# Patient Record
Sex: Male | Born: 1940 | Race: White | Hispanic: No | Smoking: Former smoker
Health system: Southern US, Community
[De-identification: ages and names within clinical notes are randomized; demographics above are authoritative.]

## PROBLEM LIST (undated history)

## (undated) DIAGNOSIS — R269 Unspecified abnormalities of gait and mobility: Secondary | ICD-10-CM

## (undated) DIAGNOSIS — F419 Anxiety disorder, unspecified: Secondary | ICD-10-CM

## (undated) DIAGNOSIS — G629 Polyneuropathy, unspecified: Secondary | ICD-10-CM

## (undated) DIAGNOSIS — J449 Chronic obstructive pulmonary disease, unspecified: Secondary | ICD-10-CM

## (undated) DIAGNOSIS — I2510 Atherosclerotic heart disease of native coronary artery without angina pectoris: Secondary | ICD-10-CM

## (undated) DIAGNOSIS — I739 Peripheral vascular disease, unspecified: Secondary | ICD-10-CM

## (undated) DIAGNOSIS — S0990XA Unspecified injury of head, initial encounter: Secondary | ICD-10-CM

## (undated) DIAGNOSIS — R0602 Shortness of breath: Secondary | ICD-10-CM

## (undated) DIAGNOSIS — H259 Unspecified age-related cataract: Secondary | ICD-10-CM

## (undated) DIAGNOSIS — H3530 Unspecified macular degeneration: Secondary | ICD-10-CM

## (undated) DIAGNOSIS — F17210 Nicotine dependence, cigarettes, uncomplicated: Secondary | ICD-10-CM

## (undated) DIAGNOSIS — F1010 Alcohol abuse, uncomplicated: Secondary | ICD-10-CM

## (undated) HISTORY — DX: Unspecified age-related cataract: H25.9

## (undated) HISTORY — DX: Polyneuropathy, unspecified: G62.9

## (undated) HISTORY — DX: Unspecified abnormalities of gait and mobility: R26.9

## (undated) HISTORY — DX: Unspecified injury of head, initial encounter: S09.90XA

## (undated) HISTORY — DX: Nicotine dependence, cigarettes, uncomplicated: F17.210

## (undated) HISTORY — DX: Alcohol abuse, uncomplicated: F10.10

## (undated) HISTORY — DX: Unspecified macular degeneration: H35.30

## (undated) HISTORY — DX: Atherosclerotic heart disease of native coronary artery without angina pectoris: I25.10

## (undated) HISTORY — PX: APPENDECTOMY: SHX54

## (undated) HISTORY — DX: Anxiety disorder, unspecified: F41.9

## (undated) HISTORY — DX: Chronic obstructive pulmonary disease, unspecified: J44.9

## (undated) HISTORY — DX: Peripheral vascular disease, unspecified: I73.9

---

## 1980-01-31 HISTORY — PX: BRAIN SURGERY: SHX531

## 1999-08-31 ENCOUNTER — Emergency Department (HOSPITAL_COMMUNITY): Admission: EM | Admit: 1999-08-31 | Discharge: 1999-08-31 | Payer: Self-pay | Admitting: Emergency Medicine

## 1999-09-01 ENCOUNTER — Encounter: Payer: Self-pay | Admitting: Emergency Medicine

## 2000-06-19 ENCOUNTER — Encounter: Payer: Self-pay | Admitting: Internal Medicine

## 2000-06-19 ENCOUNTER — Ambulatory Visit (HOSPITAL_COMMUNITY): Admission: RE | Admit: 2000-06-19 | Discharge: 2000-06-19 | Payer: Self-pay | Admitting: Internal Medicine

## 2000-09-24 ENCOUNTER — Ambulatory Visit (HOSPITAL_COMMUNITY): Admission: RE | Admit: 2000-09-24 | Discharge: 2000-09-24 | Payer: Self-pay | Admitting: Internal Medicine

## 2003-06-30 ENCOUNTER — Ambulatory Visit (HOSPITAL_COMMUNITY): Admission: RE | Admit: 2003-06-30 | Discharge: 2003-06-30 | Payer: Self-pay | Admitting: Pulmonary Disease

## 2003-11-11 ENCOUNTER — Ambulatory Visit: Payer: Self-pay | Admitting: Pulmonary Disease

## 2003-11-19 ENCOUNTER — Ambulatory Visit (HOSPITAL_COMMUNITY): Admission: RE | Admit: 2003-11-19 | Discharge: 2003-11-19 | Payer: Self-pay | Admitting: Pulmonary Disease

## 2003-12-03 ENCOUNTER — Ambulatory Visit: Payer: Self-pay | Admitting: Pulmonary Disease

## 2004-03-07 ENCOUNTER — Ambulatory Visit: Payer: Self-pay | Admitting: Pulmonary Disease

## 2004-03-19 ENCOUNTER — Emergency Department (HOSPITAL_COMMUNITY): Admission: EM | Admit: 2004-03-19 | Discharge: 2004-03-20 | Payer: Self-pay | Admitting: Emergency Medicine

## 2004-04-16 ENCOUNTER — Observation Stay (HOSPITAL_COMMUNITY): Admission: EM | Admit: 2004-04-16 | Discharge: 2004-04-17 | Payer: Self-pay | Admitting: Emergency Medicine

## 2004-04-22 ENCOUNTER — Ambulatory Visit: Payer: Self-pay | Admitting: Pulmonary Disease

## 2004-06-06 ENCOUNTER — Ambulatory Visit: Payer: Self-pay | Admitting: Pulmonary Disease

## 2004-06-09 ENCOUNTER — Ambulatory Visit: Payer: Self-pay | Admitting: Internal Medicine

## 2004-08-04 ENCOUNTER — Ambulatory Visit: Payer: Self-pay | Admitting: Orthopedic Surgery

## 2004-08-29 ENCOUNTER — Emergency Department (HOSPITAL_COMMUNITY): Admission: EM | Admit: 2004-08-29 | Discharge: 2004-08-29 | Payer: Self-pay | Admitting: *Deleted

## 2004-09-20 ENCOUNTER — Ambulatory Visit: Payer: Self-pay | Admitting: Pulmonary Disease

## 2004-10-26 ENCOUNTER — Ambulatory Visit: Payer: Self-pay | Admitting: Pulmonary Disease

## 2005-01-04 ENCOUNTER — Ambulatory Visit: Payer: Self-pay | Admitting: Pulmonary Disease

## 2005-03-15 ENCOUNTER — Inpatient Hospital Stay (HOSPITAL_COMMUNITY): Admission: EM | Admit: 2005-03-15 | Discharge: 2005-03-20 | Payer: Self-pay | Admitting: Emergency Medicine

## 2005-04-03 ENCOUNTER — Ambulatory Visit: Payer: Self-pay | Admitting: Pulmonary Disease

## 2005-05-22 ENCOUNTER — Ambulatory Visit: Payer: Self-pay | Admitting: Pulmonary Disease

## 2006-05-02 ENCOUNTER — Ambulatory Visit: Payer: Self-pay | Admitting: Pulmonary Disease

## 2006-05-02 LAB — CONVERTED CEMR LAB
Basophils Relative: 0.5 % (ref 0.0–1.0)
Bilirubin, Direct: 0.1 mg/dL (ref 0.0–0.3)
CO2: 30 meq/L (ref 19–32)
Eosinophils Relative: 1.4 % (ref 0.0–5.0)
GFR calc Af Amer: 96 mL/min
Glucose, Bld: 95 mg/dL (ref 70–99)
Hemoglobin: 11.6 g/dL — ABNORMAL LOW (ref 13.0–17.0)
Lymphocytes Relative: 10 % — ABNORMAL LOW (ref 12.0–46.0)
Monocytes Absolute: 0.4 10*3/uL (ref 0.2–0.7)
Monocytes Relative: 3.2 % (ref 3.0–11.0)
Neutro Abs: 9.5 10*3/uL — ABNORMAL HIGH (ref 1.4–7.7)
Neutrophils Relative %: 84.9 % — ABNORMAL HIGH (ref 43.0–77.0)
Potassium: 5.8 meq/L — ABNORMAL HIGH (ref 3.5–5.1)
Sodium: 139 meq/L (ref 135–145)
TSH: 0.73 microintl units/mL (ref 0.35–5.50)
Total Protein: 7.6 g/dL (ref 6.0–8.3)
WBC: 11.3 10*3/uL — ABNORMAL HIGH (ref 4.5–10.5)

## 2006-06-07 ENCOUNTER — Ambulatory Visit: Payer: Self-pay | Admitting: Orthopedic Surgery

## 2006-07-12 ENCOUNTER — Emergency Department (HOSPITAL_COMMUNITY): Admission: EM | Admit: 2006-07-12 | Discharge: 2006-07-13 | Payer: Self-pay | Admitting: Emergency Medicine

## 2006-07-22 ENCOUNTER — Emergency Department (HOSPITAL_COMMUNITY): Admission: EM | Admit: 2006-07-22 | Discharge: 2006-07-22 | Payer: Self-pay | Admitting: Emergency Medicine

## 2006-09-18 ENCOUNTER — Ambulatory Visit: Payer: Self-pay | Admitting: Pulmonary Disease

## 2006-11-13 DIAGNOSIS — I872 Venous insufficiency (chronic) (peripheral): Secondary | ICD-10-CM | POA: Insufficient documentation

## 2006-11-13 DIAGNOSIS — F1010 Alcohol abuse, uncomplicated: Secondary | ICD-10-CM | POA: Insufficient documentation

## 2006-11-13 DIAGNOSIS — J4489 Other specified chronic obstructive pulmonary disease: Secondary | ICD-10-CM | POA: Insufficient documentation

## 2006-11-13 DIAGNOSIS — G589 Mononeuropathy, unspecified: Secondary | ICD-10-CM | POA: Insufficient documentation

## 2006-11-13 DIAGNOSIS — F411 Generalized anxiety disorder: Secondary | ICD-10-CM | POA: Insufficient documentation

## 2006-11-13 DIAGNOSIS — J449 Chronic obstructive pulmonary disease, unspecified: Secondary | ICD-10-CM

## 2007-04-09 ENCOUNTER — Ambulatory Visit: Payer: Self-pay | Admitting: Pulmonary Disease

## 2007-04-09 DIAGNOSIS — F17200 Nicotine dependence, unspecified, uncomplicated: Secondary | ICD-10-CM | POA: Insufficient documentation

## 2007-04-09 DIAGNOSIS — I739 Peripheral vascular disease, unspecified: Secondary | ICD-10-CM | POA: Insufficient documentation

## 2007-04-09 DIAGNOSIS — R269 Unspecified abnormalities of gait and mobility: Secondary | ICD-10-CM

## 2007-04-17 ENCOUNTER — Encounter: Payer: Self-pay | Admitting: Pulmonary Disease

## 2007-04-17 ENCOUNTER — Telehealth (INDEPENDENT_AMBULATORY_CARE_PROVIDER_SITE_OTHER): Payer: Self-pay

## 2007-04-20 DIAGNOSIS — S0990XA Unspecified injury of head, initial encounter: Secondary | ICD-10-CM | POA: Insufficient documentation

## 2007-04-20 LAB — CONVERTED CEMR LAB
ALT: 14 units/L (ref 0–53)
AST: 20 units/L (ref 0–37)
Alkaline Phosphatase: 31 units/L — ABNORMAL LOW (ref 39–117)
BUN: 15 mg/dL (ref 6–23)
Basophils Relative: 0 % (ref 0.0–1.0)
Bilirubin, Direct: 0.1 mg/dL (ref 0.0–0.3)
CO2: 33 meq/L — ABNORMAL HIGH (ref 19–32)
Calcium: 9.9 mg/dL (ref 8.4–10.5)
Chloride: 103 meq/L (ref 96–112)
Eosinophils Absolute: 0.3 10*3/uL (ref 0.0–0.6)
Eosinophils Relative: 2.8 % (ref 0.0–5.0)
GFR calc non Af Amer: 89 mL/min
Glucose, Bld: 96 mg/dL (ref 70–99)
Monocytes Relative: 5.7 % (ref 3.0–11.0)
Platelets: 262 10*3/uL (ref 150–400)
RBC: 4.49 M/uL (ref 4.22–5.81)
WBC: 12.4 10*3/uL — ABNORMAL HIGH (ref 4.5–10.5)

## 2007-10-08 ENCOUNTER — Ambulatory Visit: Payer: Self-pay | Admitting: Pulmonary Disease

## 2008-03-31 ENCOUNTER — Ambulatory Visit: Payer: Self-pay | Admitting: Pulmonary Disease

## 2008-03-31 DIAGNOSIS — J209 Acute bronchitis, unspecified: Secondary | ICD-10-CM | POA: Insufficient documentation

## 2008-03-31 LAB — CONVERTED CEMR LAB
AST: 24 units/L (ref 0–37)
Albumin: 3.7 g/dL (ref 3.5–5.2)
BUN: 14 mg/dL (ref 6–23)
Basophils Absolute: 0 10*3/uL (ref 0.0–0.1)
Basophils Relative: 0.2 % (ref 0.0–3.0)
Calcium: 9.5 mg/dL (ref 8.4–10.5)
Creatinine, Ser: 0.9 mg/dL (ref 0.4–1.5)
Eosinophils Absolute: 0.2 10*3/uL (ref 0.0–0.7)
Eosinophils Relative: 1.7 % (ref 0.0–5.0)
GFR calc Af Amer: 108 mL/min
GFR calc non Af Amer: 89 mL/min
HCT: 39.4 % (ref 39.0–52.0)
Hemoglobin: 13.6 g/dL (ref 13.0–17.0)
MCHC: 34.6 g/dL (ref 30.0–36.0)
MCV: 90.2 fL (ref 78.0–100.0)
Monocytes Absolute: 0.5 10*3/uL (ref 0.1–1.0)
Neutro Abs: 7.3 10*3/uL (ref 1.4–7.7)
Neutrophils Relative %: 79.3 % — ABNORMAL HIGH (ref 43.0–77.0)
PSA: 0.37 ng/mL (ref 0.10–4.00)
RBC: 4.36 M/uL (ref 4.22–5.81)
TSH: 0.53 microintl units/mL (ref 0.35–5.50)
Total Bilirubin: 0.9 mg/dL (ref 0.3–1.2)
WBC: 9.3 10*3/uL (ref 4.5–10.5)

## 2008-04-01 ENCOUNTER — Telehealth (INDEPENDENT_AMBULATORY_CARE_PROVIDER_SITE_OTHER): Payer: Self-pay | Admitting: *Deleted

## 2008-05-09 ENCOUNTER — Encounter: Payer: Self-pay | Admitting: Pulmonary Disease

## 2008-05-19 ENCOUNTER — Ambulatory Visit: Payer: Self-pay | Admitting: Pulmonary Disease

## 2008-05-19 ENCOUNTER — Ambulatory Visit: Payer: Self-pay | Admitting: Internal Medicine

## 2008-05-20 DIAGNOSIS — S20219A Contusion of unspecified front wall of thorax, initial encounter: Secondary | ICD-10-CM

## 2008-08-21 ENCOUNTER — Ambulatory Visit: Payer: Self-pay | Admitting: Internal Medicine

## 2008-08-21 ENCOUNTER — Encounter: Payer: Self-pay | Admitting: Adult Health

## 2008-08-21 DIAGNOSIS — H3530 Unspecified macular degeneration: Secondary | ICD-10-CM | POA: Insufficient documentation

## 2008-08-21 DIAGNOSIS — H259 Unspecified age-related cataract: Secondary | ICD-10-CM | POA: Insufficient documentation

## 2008-08-21 LAB — CONVERTED CEMR LAB: INR: 0.9 (ref 0.8–1.0)

## 2008-10-01 ENCOUNTER — Ambulatory Visit: Payer: Self-pay | Admitting: Pulmonary Disease

## 2008-10-04 ENCOUNTER — Inpatient Hospital Stay (HOSPITAL_COMMUNITY): Admission: EM | Admit: 2008-10-04 | Discharge: 2008-10-09 | Payer: Self-pay | Admitting: Emergency Medicine

## 2008-11-02 ENCOUNTER — Encounter: Payer: Self-pay | Admitting: Pulmonary Disease

## 2008-11-10 ENCOUNTER — Ambulatory Visit: Payer: Self-pay | Admitting: Pulmonary Disease

## 2009-04-15 ENCOUNTER — Ambulatory Visit: Payer: Self-pay | Admitting: Pulmonary Disease

## 2009-04-16 LAB — CONVERTED CEMR LAB
AST: 25 units/L (ref 0–37)
Alkaline Phosphatase: 35 units/L — ABNORMAL LOW (ref 39–117)
Basophils Absolute: 0 10*3/uL (ref 0.0–0.1)
Basophils Relative: 0.3 % (ref 0.0–3.0)
Bilirubin, Direct: 0.1 mg/dL (ref 0.0–0.3)
CO2: 28 meq/L (ref 19–32)
Calcium: 9.8 mg/dL (ref 8.4–10.5)
Creatinine, Ser: 1.1 mg/dL (ref 0.4–1.5)
Eosinophils Absolute: 0.3 10*3/uL (ref 0.0–0.7)
GFR calc non Af Amer: 70.36 mL/min (ref >60)
Hemoglobin: 13.1 g/dL (ref 13.0–17.0)
Lymphocytes Relative: 12.5 % (ref 12.0–46.0)
MCHC: 33.1 g/dL (ref 30.0–36.0)
Monocytes Relative: 3.3 % (ref 3.0–12.0)
Neutro Abs: 11.4 10*3/uL — ABNORMAL HIGH (ref 1.4–7.7)
Neutrophils Relative %: 82.1 % — ABNORMAL HIGH (ref 43.0–77.0)
RBC: 4.35 M/uL (ref 4.22–5.81)
RDW: 13 % (ref 11.5–14.6)
Sodium: 137 meq/L (ref 135–145)

## 2009-04-19 ENCOUNTER — Telehealth (INDEPENDENT_AMBULATORY_CARE_PROVIDER_SITE_OTHER): Payer: Self-pay | Admitting: *Deleted

## 2009-05-14 ENCOUNTER — Telehealth (INDEPENDENT_AMBULATORY_CARE_PROVIDER_SITE_OTHER): Payer: Self-pay | Admitting: *Deleted

## 2009-09-15 ENCOUNTER — Telehealth (INDEPENDENT_AMBULATORY_CARE_PROVIDER_SITE_OTHER): Payer: Self-pay | Admitting: *Deleted

## 2009-11-12 ENCOUNTER — Telehealth (INDEPENDENT_AMBULATORY_CARE_PROVIDER_SITE_OTHER): Payer: Self-pay | Admitting: *Deleted

## 2009-11-18 ENCOUNTER — Ambulatory Visit: Payer: Self-pay | Admitting: Pulmonary Disease

## 2009-11-18 ENCOUNTER — Telehealth: Payer: Self-pay | Admitting: Pulmonary Disease

## 2009-12-13 ENCOUNTER — Encounter: Payer: Self-pay | Admitting: Pulmonary Disease

## 2009-12-21 ENCOUNTER — Telehealth: Payer: Self-pay | Admitting: Pulmonary Disease

## 2010-03-01 NOTE — Progress Notes (Signed)
Summary: vicodin refill  Phone Note Call from Patient   Caller: Daughter Call For: nadel Summary of Call: pt was seen 3/17. needs a new rx for vicodin. same pharmacy # [phone]. daughter Pura Spice calling at [phone] Initial call taken by: Tivis Ringer, CNA,  April 19, 2009 2:48 PM  Follow-up for Phone Call        rx sent to pharmacy. daughter aware.  Aundra Millet Reynolds LPN  April 19, 2009 2:53 PM    Prescriptions: VICODIN 5-500 MG  TABS (HYDROCODONE-ACETAMINOPHEN) take 1 by mouth every 4-6 hrs as needed pain-not to exceed three per day  #90 x 2   Entered by:   Arman Filter LPN   Authorized by:   Michele Mcalpine MD   Signed by:   Arman Filter LPN on 17/61/6073   Method used:   Telephoned to ...       [facility] (retail)       [address]       [place], [place]  [address]       Ph: [phone]       Fax: [phone]   RxID:   [id number]
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
## 2010-03-01 NOTE — Progress Notes (Signed)
Summary: wants new rx  Phone Note Call from Patient Call back at [phone]   Caller: Daughter--donna jones Call For: nadel Summary of Call: would like rx for xanax to help with nerves--pls advise if this is ok--pt last saw sn 09/2008 and tp 03/2009  Initial call taken by: Philipp Deputy CMA,  May 14, 2009 9:50 AM Caller: Antonio Marshall  Follow-up for Phone Call        donna aware that per SN---unable to give him this rx for xanax.  pt is currently taking zoloft [name] CMA  May 18, 2009 9:59 AM

## 2010-03-01 NOTE — Progress Notes (Signed)
Summary: refill onhydrocodone--med reday at pharmacy  Phone Note Call from Patient   Caller: Daughter donna Call For: nadel Summary of Call: need [facility]one [facility] Initial call taken by: Rickard Patience,  December 21, 2009 10:30 AM  Follow-up for Phone Call        Looks like hydrocodone was refilled yeaterday. [facility] called [facility] to verify and they have medication ready for the pt. I LMTCBx1 with pt daughter to advsie.Carron Curie CMA  December 21, 2009 10:40 AM  spoke with [name] and made aware rx ready. Carron Curie CMA  December 21, 2009 10:47 AM

## 2010-03-01 NOTE — Progress Notes (Signed)
Summary: meds  Phone Note Call from Patient Call back at [phone]   Caller: daughter-[name] Call For: nadel Reason for Call: Talk to Nurse Summary of Call: pt needs an rx for Vicodin refilled, [facility] Initial call taken by: Eugene Gavia,  September 15, 2009 11:13 AM  Follow-up for Phone Call        Spoke with [facility] refill sent and to keep appt in Oct with SN.Reynaldo Minium CMA  September 15, 2009 11:53 AM     Prescriptions: VICODIN 5-500 MG  TABS (HYDROCODONE-ACETAMINOPHEN) take 1 by mouth every 4-6 hrs as needed pain-not to exceed three per day  #90 x 2   Entered by:   Reynaldo Minium CMA   Authorized by:   Michele Mcalpine MD   Signed by:   Reynaldo Minium CMA on 09/15/2009   Method used:   Telephoned to ...       [facility]  [address] [phone]* (retail)       [address]       [place], [place]  [address]       Ph: [phone] or [phone]       Fax: [phone]   RxID:   [id number]

## 2010-03-01 NOTE — Letter (Signed)
Summary: [facility]  [facility]   Imported By: Lester [place] 12/17/2009 11:21:51  _____________________________________________________________________  External Attachment:    Type:   Image     Comment:   External Document

## 2010-03-01 NOTE — Progress Notes (Signed)
Summary: rsc for next wk w/ nadel---appt 11/18/2009  Phone Note Call from Patient   Caller: Daughter-donna jones Call For: nadel Summary of Call: needs to rsc w/ nadel for next week. (had an appt but is sick and cancelled).  Initial call taken by: Tivis Ringer, CNA,  November 12, 2009 9:17 AM  Follow-up for Phone Call        ATC pt's daughter back at home #.  Line has been disconnected.  Found an alternative # in a previous message and called that # instead......[phone].  SPoke with pt's daughter, Antonio Marshall, and rescheduled pt to see SN 11/18/2009 at 9:00am.  Arman Filter LPN  November 12, 2009 10:42 AM

## 2010-03-01 NOTE — Progress Notes (Signed)
Summary: order   [id number]  Phone Note From Other Clinic Call back at [phone] ex [phone]   [facility] Call For: nadel Summary of Call: need verbral order for bed. Initial call taken by: Rickard Patience,  November 18, 2009 3:40 PM  Follow-up for Phone Call        [facility].  Pt was just seen today by SN and order was sent to [facility] for a new mattress.  Unsure what she is needing.  Aundra Millet Reynolds LPN  November 18, 2009 3:42 PM   Derrin (covering for [place] with [facility]) stated that the patient was supplied a hosp. bed in the past (maybe from [place]). [facility] is unable to provide a new mattress, they would have to get pt a new bed. Is this ok? Or do you want me to try [facility]? Please advise. Alfonso Ramus  November 18, 2009 3:46 PM   Additional Follow-up for Phone Call Additional follow up Details #1::        lmom,tcb for aisha---- [name] CMA  November 18, 2009 4:45 PM     Arley Phenix called me back and she will send the order in to [facility] for a new bed for this pt---pts daughter is aware [name] [facility]  November 18, 2009 4:52 PM
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
## 2010-04-24 ENCOUNTER — Other Ambulatory Visit: Payer: Self-pay | Admitting: Adult Health

## 2010-05-06 LAB — URINALYSIS, ROUTINE W REFLEX MICROSCOPIC
Bilirubin Urine: NEGATIVE
Glucose, UA: NEGATIVE mg/dL
Ketones, ur: 15 mg/dL — AB
Specific Gravity, Urine: 1.025 (ref 1.005–1.030)
pH: 6 (ref 5.0–8.0)

## 2010-05-06 LAB — COMPREHENSIVE METABOLIC PANEL
ALT: 31 U/L (ref 0–53)
AST: 37 U/L (ref 0–37)
Albumin: 2.8 g/dL — ABNORMAL LOW (ref 3.5–5.2)
Albumin: 3 g/dL — ABNORMAL LOW (ref 3.5–5.2)
BUN: 15 mg/dL (ref 6–23)
BUN: 19 mg/dL (ref 6–23)
CO2: 18 mEq/L — ABNORMAL LOW (ref 19–32)
CO2: 27 mEq/L (ref 19–32)
Calcium: 8.7 mg/dL (ref 8.4–10.5)
Chloride: 101 mEq/L (ref 96–112)
Chloride: 109 mEq/L (ref 96–112)
Creatinine, Ser: 0.8 mg/dL (ref 0.4–1.5)
Creatinine, Ser: 0.92 mg/dL (ref 0.4–1.5)
Creatinine, Ser: 1 mg/dL (ref 0.4–1.5)
GFR calc Af Amer: >60 mL/min (ref >60)
GFR calc non Af Amer: >60 mL/min (ref >60)
GFR calc non Af Amer: >60 mL/min (ref >60)
Glucose, Bld: 85 mg/dL (ref 70–99)
Potassium: 3.6 mEq/L (ref 3.5–5.1)
Sodium: 140 mEq/L (ref 135–145)
Total Bilirubin: 0.6 mg/dL (ref 0.3–1.2)
Total Bilirubin: 1.3 mg/dL — ABNORMAL HIGH (ref 0.3–1.2)
Total Protein: 6 g/dL (ref 6.0–8.3)

## 2010-05-06 LAB — CBC
HCT: 30.9 % — ABNORMAL LOW (ref 39.0–52.0)
HCT: 31.8 % — ABNORMAL LOW (ref 39.0–52.0)
HCT: 33.8 % — ABNORMAL LOW (ref 39.0–52.0)
HCT: 37.9 % — ABNORMAL LOW (ref 39.0–52.0)
Hemoglobin: 10.6 g/dL — ABNORMAL LOW (ref 13.0–17.0)
MCHC: 34.5 g/dL (ref 30.0–36.0)
MCHC: 35.4 g/dL (ref 30.0–36.0)
MCV: 88 fL (ref 78.0–100.0)
MCV: 89.6 fL (ref 78.0–100.0)
MCV: 90.1 fL (ref 78.0–100.0)
Platelets: 167 10*3/uL (ref 150–400)
Platelets: 169 10*3/uL (ref 150–400)
Platelets: 197 10*3/uL (ref 150–400)
RBC: 3.53 MIL/uL — ABNORMAL LOW (ref 4.22–5.81)
RBC: 3.7 MIL/uL — ABNORMAL LOW (ref 4.22–5.81)
RBC: 4.3 MIL/uL (ref 4.22–5.81)
RDW: 13.9 % (ref 11.5–15.5)
RDW: 14 % (ref 11.5–15.5)
WBC: 24.1 10*3/uL — ABNORMAL HIGH (ref 4.0–10.5)
WBC: 7.4 10*3/uL (ref 4.0–10.5)
WBC: 8.2 10*3/uL (ref 4.0–10.5)

## 2010-05-06 LAB — BASIC METABOLIC PANEL
Calcium: 8.8 mg/dL (ref 8.4–10.5)
Chloride: 108 mEq/L (ref 96–112)
Chloride: 109 mEq/L (ref 96–112)
Creatinine, Ser: 0.91 mg/dL (ref 0.4–1.5)
Creatinine, Ser: 0.97 mg/dL (ref 0.4–1.5)
GFR calc Af Amer: >60 mL/min (ref >60)
GFR calc Af Amer: >60 mL/min (ref >60)
GFR calc non Af Amer: >60 mL/min (ref >60)
Potassium: 3.7 mEq/L (ref 3.5–5.1)

## 2010-05-06 LAB — CARDIAC PANEL(CRET KIN+CKTOT+MB+TROPI)
Relative Index: 1 (ref 0.0–2.5)
Relative Index: 1 (ref 0.0–2.5)
Total CK: 2445 U/L — ABNORMAL HIGH (ref 7–232)
Troponin I: 0.03 ng/mL (ref 0.00–0.06)
Troponin I: 0.03 ng/mL (ref 0.00–0.06)

## 2010-05-06 LAB — DIFFERENTIAL
Basophils Absolute: 0 10*3/uL (ref 0.0–0.1)
Basophils Absolute: 0 10*3/uL (ref 0.0–0.1)
Basophils Absolute: 0 10*3/uL (ref 0.0–0.1)
Basophils Relative: 0 % (ref 0–1)
Basophils Relative: 0 % (ref 0–1)
Eosinophils Absolute: 0 10*3/uL (ref 0.0–0.7)
Eosinophils Absolute: 0.2 10*3/uL (ref 0.0–0.7)
Lymphocytes Relative: 14 % (ref 12–46)
Lymphocytes Relative: 16 % (ref 12–46)
Lymphocytes Relative: 5 % — ABNORMAL LOW (ref 12–46)
Lymphocytes Relative: 9 % — ABNORMAL LOW (ref 12–46)
Lymphs Abs: 1.1 10*3/uL (ref 0.7–4.0)
Lymphs Abs: 1.2 10*3/uL (ref 0.7–4.0)
Monocytes Absolute: 0.4 10*3/uL (ref 0.1–1.0)
Monocytes Absolute: 0.6 10*3/uL (ref 0.1–1.0)
Monocytes Relative: 4 % (ref 3–12)
Monocytes Relative: 4 % (ref 3–12)
Monocytes Relative: 4 % (ref 3–12)
Monocytes Relative: 9 % (ref 3–12)
Neutro Abs: 4.2 10*3/uL (ref 1.7–7.7)
Neutro Abs: 5.8 10*3/uL (ref 1.7–7.7)
Neutro Abs: 8.5 10*3/uL — ABNORMAL HIGH (ref 1.7–7.7)
Neutrophils Relative %: 78 % — ABNORMAL HIGH (ref 43–77)
Neutrophils Relative %: 82 % — ABNORMAL HIGH (ref 43–77)
Neutrophils Relative %: 91 % — ABNORMAL HIGH (ref 43–77)

## 2010-05-06 LAB — HEMOCCULT GUIAC POC 1CARD (OFFICE): Fecal Occult Bld: NEGATIVE

## 2010-05-06 LAB — PROTIME-INR
INR: 1 (ref 0.00–1.49)
Prothrombin Time: 12.9 seconds (ref 11.6–15.2)

## 2010-05-06 LAB — PHOSPHORUS: Phosphorus: 2.9 mg/dL (ref 2.3–4.6)

## 2010-05-06 LAB — IRON AND TIBC
Iron: 110 ug/dL (ref 42–135)
Saturation Ratios: 45 % (ref 20–55)
UIBC: 136 ug/dL

## 2010-05-06 LAB — VITAMIN B12: Vitamin B-12: 416 pg/mL (ref 211–911)

## 2010-05-06 LAB — CK: Total CK: 238 U/L — ABNORMAL HIGH (ref 7–232)

## 2010-05-06 LAB — BRAIN NATRIURETIC PEPTIDE: Pro B Natriuretic peptide (BNP): 167 pg/mL — ABNORMAL HIGH (ref 0.0–100.0)

## 2010-05-06 LAB — APTT: aPTT: 26 seconds (ref 24–37)

## 2010-05-06 LAB — URINE CULTURE

## 2010-05-06 LAB — CK TOTAL AND CKMB (NOT AT ARMC)
CK, MB: 8.5 ng/mL — ABNORMAL HIGH (ref 0.3–4.0)
Total CK: 2727 U/L — ABNORMAL HIGH (ref 7–232)
Total CK: 596 U/L — ABNORMAL HIGH (ref 7–232)

## 2010-05-06 LAB — MAGNESIUM: Magnesium: 2.3 mg/dL (ref 1.5–2.5)

## 2010-05-17 ENCOUNTER — Encounter: Payer: Self-pay | Admitting: Pulmonary Disease

## 2010-05-18 ENCOUNTER — Ambulatory Visit: Payer: Self-pay | Admitting: Pulmonary Disease

## 2010-06-02 ENCOUNTER — Ambulatory Visit: Payer: Self-pay | Admitting: Pulmonary Disease
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
## 2010-06-17 NOTE — Discharge Summary (Signed)
NAME:  Antonio Marshall, Antonio Marshall NO.:  [id number]   MEDICAL RECORD NO.:  [id number]          PATIENT TYPE:  INP   LOCATION:  A213                          FACILITY:  [facility]   PHYSICIAN:  Mobolaji B. Bakare, M.D.DATE OF BIRTH:  01-Dec-1940   DATE OF ADMISSION:  04/16/2004  DATE OF DISCHARGE:  03/19/2006LH                                 DISCHARGE SUMMARY   PRIMARY CARE PHYSICIAN:  Lonzo Cloud. Kriste Basque, M.D.   FINAL DIAGNOSES:  1.  Chronic obstructive pulmonary disease exacerbation.  2.  Tobacco abuse.  3.  Alcohol abuse.  4.  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   CHIEF COMPLAINT:  Shortness of breath.   HISTORY OF PRESENT ILLNESS:  Please refer to the admission H&P.  In brief,  Antonio Marshall is a 70 year old Caucasian male with a history of COPD with  significant history of smoking.  Was smoking three packs of cigarettes per  day up until two days ago when he decided to quit.  He presented to the  emergency department with shortness of breath and increased sputum  production, but there was no documented fever.  The patient was admitted for  COPD exacerbation, nebulization, and IV steroids.   PHYSICAL EXAMINATION:  VITAL SIGNS:  Temperature was 97.6, blood pressure  158/68, pulse of 81, O2 saturation of 96% on 2 L.  RESPIRATORY:  Diffuse wheezes and scattered rhonchi with decreased air entry  bilaterally, otherwise, the rest of the physical examination was within  normal.  It did not demonstrate any DT's at the time of admission.   LABORATORY DATA:  CBC at the time of discharge:  White blood cell count 8.6,  hemoglobin 14.4, hematocrit 41.2, platelets 295.  Neutrophils 91%,  lymphocytes 18%.  Sodium 131, potassium 5.2, chloride 94, CO2 of 28, glucose  202, BUN 15, creatinine 0.9, calcium 9.8.  Blood cultures were negative.  Radiological data:  Chest x-ray showed small bilateral pleural effusions,  chronic interstitial lung disease compatible with COPD which is stable.   HOSPITAL COURSE:  Antonio Marshall was admitted for stabilization.  He was  started on IV Solu-Medrol, IV ceftriaxone, and Zithromax.  He did not have  any documented fever.  He was saturating 97% on 2 L, and 91% on room air.  The patient was ambulated without difficulty.  There were no wheezes or  rhonchi on lung examination.  It was then fit that he could be discharged  home on p.o. steroids and p.o. Avelox.  In addition, Advair was added to his  medications which he can continue as a heart patient.   The patient was counseled about smoking cessation, and indeed indicates that  he has finally made up his mind to quit two days ago, and was encouraged to  follow up with smoking cessation counseling.  He declined using a nicotine  patch.   He did not have any signs of DT's during this hospitalization.  He was  discharged home on thiamine, folic acid, and is to continue with his vitamin  B12.   CONDITION ON DISCHARGE:  The patient was stable hemodynamically and  ambulated  quite well.   DISCHARGE MEDICATIONS:  1.  Combivent two puffs q.i.d.  2.  Avelox 400 mg one daily x5 days.  3.  Prednisone taper over 10 days.  4.  Advair 250/50 two puffs b.i.d.  5.  Zoloft 100 mg one daily.  6.  Vitamin B12 as before.  7.  Folic acid one p.o. daily.  8.  Thiamine 100 mg one p.o. daily.   FOLLOWUP:  With Dr. Kriste Basque in one week.   Instructed to continue to quit smoking.      MBB/MEDQ  D:  04/17/2004  T:  04/17/2004  Job:  [id number]   cc:   Lonzo Cloud. Kriste Basque, M.D. [facility]

## 2010-06-17 NOTE — Group Therapy Note (Signed)
NAME:  Antonio Marshall, Antonio Marshall NO.:  [id number]   MEDICAL RECORD NO.:  [id number]          PATIENT TYPE:  INP   LOCATION:  A206                          FACILITY:  [facility]   PHYSICIAN:  Margaretmary Dys, M.D.DATE OF BIRTH:  May 01, 1940   DATE OF PROCEDURE:  03/19/2005  DATE OF DISCHARGE:                                   PROGRESS NOTE   SUBJECTIVE:  The patient feels better today.  The patient apparently fell  while trying to get out of bed.  There was no syncopal episode.  He has some  minor bruises on his left frontal area.   OBJECTIVE:  GENERAL:  Conscious, alert, comfortable, not in acute distress.  VITAL SIGNS:  Blood pressure was 122/59, pulse of 87, respirations of 21, T-  max of 97, blood sugar ranged between 125 to 148, oxygen saturation was 98%  on 3 L.  HEENT:  The patient has a bruise on his left forehead.  No lacerations were  noted.  Oral mucosa was moist with no exudates.  NECK:  Supple.  No JVD or lymphadenopathy.  LUNGS:  Reduced air entry bilaterally with occasional rhonchi.  HEART:  S1, S2 regular.  No S3, S4, gallops, or rubs.  ABDOMEN:  Soft, nontender, bowel sounds positive.  EXTREMITIES:  No edema.   LABORATORY/DIAGNOSTIC DATA:  PT was 12.9, INR 1.   ASSESSMENT AND PLAN:  1.  Acute chronic obstructive pulmonary disease exacerbation.  The patient      appears to be improving.  I will continue on the current therapy with      nebulizers.  We will switch Solu-Medrol to oral prednisone.  We will      continue on Levaquin 750 mg IV.  2.  Fall.  This was accidental.  There was no evidence of syncope, no      evidence of abnormal rhythm on telemetry.  We will continue on current      parameters monitoring closely for seizure activity or syncope.  The      patient will be put on fall precautions.  3.  Depression.  We will continue on Zoloft at this time.  4.  Deep vein thrombosis prophylaxis with Lovenox.  5.  Gastrointestinal prophylaxis with  Protonix.      Margaretmary Dys, M.D.  Electronically Signed     AM/MEDQ  D:  03/19/2005  T:  03/19/2005  Job:  [id number]
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
## 2010-06-17 NOTE — Discharge Summary (Signed)
NAME:  Antonio Marshall, Antonio Marshall NO.:  [id number]   MEDICAL RECORD NO.:  [id number]          PATIENT TYPE:  INP   LOCATION:  A206                          FACILITY:  [facility]   PHYSICIAN:  Lonia Blood, M.D.       DATE OF BIRTH:  Jun 20, 1940   DATE OF ADMISSION:  03/15/2005  DATE OF DISCHARGE:  02/19/2007LH                                 DISCHARGE SUMMARY   DISCHARGE DIAGNOSES:  1.  Chronic obstructive pulmonary disease exacerbation, resolving.  2.  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  3.  Depression.  4.  Alcohol abuse.  5.  Chronic gait disorder.  6.  Hyperglycemia secondary to steroid use.  7.  Hypokalemia secondary to steroids, resolved.  8.  Acute bronchitis.  9.  Depression.   DISCHARGE MEDICATIONS:  1.  Vicodin B1 100 mg p.o. daily.  2.  Folic acid 1 mg p.o. daily.  3.  Zoloft 100 mg p.o. daily.  4.  Ativan 1 mg p.o. daily, not to be taken with alcohol.  5.  Prednisone 40 mg p.o. daily from February 20-27, 2007; 20 mg p.o. daily      from February 28-April 04, 2005; 10 mg daily from March 7-13, 2007; then      stop.  6.  Levaquin 100 mg p.o. daily x3 days.  7.  Albuterol 625 mg nebulizers q.i.d.  8.  Atrovent 0.5 mg nebulizers q.i.d.  9.  Enalapril 5 mg p.o. daily.   CONDITION ON DISCHARGE:  Good.  Oxygen saturations on room air were  acceptable.  Oxygen saturation on the morning of discharge was 95% on room  air.  The patient ws discharged under the care of his daughter, and he was  set up with Home Health PT.  He was instructed not to drink any alcohol and  to follow up with Dr. Alroy Dust in 1-2 weeks.   PROCEDURES:  On the day of admission, no procedures were done.   CONSULTATIONS:  No consultations were obtained.  The patient was seen in  consultation by he physical therapist on March 20, 2005.  He was being  safe to return home under the care of his daughter.   ADMISSION HISTORY AND PHYSICAL:  Please refer to H&P done by Dr.  Rito Ehrlich.   HISTORY OF PRESENT ILLNESS:  Briefly, on March 15, 2005, Antonio Marshall is a  70 year old man with known history of tobacco abuse who was admitted with  increased shortness of breath, increased use of his nebulizers, coughing.   HOSPITAL COURSE:  #1 - CHRONIC OBSTRUCTIVE PULMONARY DISEASE EXACERBATION.  Antonio Marshall was admitted to the acute __________ .  He was started on  intravenous steroids, nebulizers and oxygen.  He was also started on  intravenous Levaquin.  He made a speedy recovery.  On hospital day #3, he  was switched to oral steroids.  He remained without wheezing, and actually  had good oxygen saturation on room air.  He was discharged home with steroid  taper and antibiotics.   #2 - ALCOHOL ABUSE.  Antonio Marshall was kept on an Ativan  taper here at [facility].  He will continue that at home.  He will also continue on his  daily Zoloft.  The patient will also continue thiamine and folic acid at the  time of discharge.  He was instructed to abstain from alcohol use at home.   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  Antonio Marshall was started on an ACE  inhibitor here in the hospital, and he will continue that at home.   #4 - GAIT DISORDER.  Mr.  Marshall is well known with balance problems  secondary to head trauma .  He was evaluated by the physical therapist and  he was deemed fit to go home.  He used a rolling walker and was to continue  home health physical therapies.      Lonia Blood, M.D.  Electronically Signed     SL/MEDQ  D:  03/20/2005  T:  03/21/2005  Job:  [id number]   cc:   Lonzo Cloud. Kriste Basque, M.D. [facility]  [address]  [place]  [place] [address]

## 2010-06-24 ENCOUNTER — Other Ambulatory Visit: Payer: Self-pay | Admitting: Adult Health

## 2010-07-05 ENCOUNTER — Other Ambulatory Visit: Payer: Self-pay | Admitting: *Deleted

## 2010-07-06 ENCOUNTER — Ambulatory Visit: Payer: Self-pay | Admitting: Pulmonary Disease

## 2010-07-07 ENCOUNTER — Ambulatory Visit (INDEPENDENT_AMBULATORY_CARE_PROVIDER_SITE_OTHER): Payer: Medicare Other | Admitting: Pulmonary Disease

## 2010-07-07 ENCOUNTER — Encounter: Payer: Self-pay | Admitting: Pulmonary Disease

## 2010-07-07 ENCOUNTER — Other Ambulatory Visit (INDEPENDENT_AMBULATORY_CARE_PROVIDER_SITE_OTHER): Payer: Medicare Other

## 2010-07-07 ENCOUNTER — Other Ambulatory Visit: Payer: Self-pay | Admitting: Adult Health

## 2010-07-07 ENCOUNTER — Other Ambulatory Visit: Payer: Self-pay | Admitting: Pulmonary Disease

## 2010-07-07 DIAGNOSIS — G589 Mononeuropathy, unspecified: Secondary | ICD-10-CM

## 2010-07-07 DIAGNOSIS — N139 Obstructive and reflux uropathy, unspecified: Secondary | ICD-10-CM

## 2010-07-07 DIAGNOSIS — F411 Generalized anxiety disorder: Secondary | ICD-10-CM

## 2010-07-07 DIAGNOSIS — F1010 Alcohol abuse, uncomplicated: Secondary | ICD-10-CM

## 2010-07-07 DIAGNOSIS — I2510 Atherosclerotic heart disease of native coronary artery without angina pectoris: Secondary | ICD-10-CM

## 2010-07-07 DIAGNOSIS — R269 Unspecified abnormalities of gait and mobility: Secondary | ICD-10-CM

## 2010-07-07 DIAGNOSIS — J449 Chronic obstructive pulmonary disease, unspecified: Secondary | ICD-10-CM

## 2010-07-07 DIAGNOSIS — I872 Venous insufficiency (chronic) (peripheral): Secondary | ICD-10-CM

## 2010-07-07 DIAGNOSIS — I739 Peripheral vascular disease, unspecified: Secondary | ICD-10-CM

## 2010-07-07 LAB — LIPID PANEL: HDL: 45.8 mg/dL (ref >39.00)

## 2010-07-07 LAB — CBC WITH DIFFERENTIAL/PLATELET
Basophils Relative: 0.4 % (ref 0.0–3.0)
Eosinophils Relative: 4.3 % (ref 0.0–5.0)
Hemoglobin: 13.6 g/dL (ref 13.0–17.0)
Lymphocytes Relative: 22.5 % (ref 12.0–46.0)
Monocytes Relative: 7.1 % (ref 3.0–12.0)
Neutro Abs: 6.1 10*3/uL (ref 1.4–7.7)
RBC: 4.48 Mil/uL (ref 4.22–5.81)

## 2010-07-07 LAB — BASIC METABOLIC PANEL
BUN: 21 mg/dL (ref 6–23)
CO2: 26 mEq/L (ref 19–32)
Chloride: 101 mEq/L (ref 96–112)
GFR: 80.11 mL/min (ref >60.00)
Glucose, Bld: 83 mg/dL (ref 70–99)
Potassium: 4.6 mEq/L (ref 3.5–5.1)
Sodium: 140 mEq/L (ref 135–145)

## 2010-07-07 LAB — HEPATIC FUNCTION PANEL
ALT: 12 U/L (ref 0–53)
AST: 16 U/L (ref 0–37)
Albumin: 4 g/dL (ref 3.5–5.2)
Alkaline Phosphatase: 33 U/L — ABNORMAL LOW (ref 39–117)
Total Protein: 7.7 g/dL (ref 6.0–8.3)

## 2010-07-07 NOTE — Patient Instructions (Signed)
Today we updated your med list in EPIC...    Continue your current meds the same & try to cut back on the alcohol/ beer...  Today we did your follow up blood work...    Please call the PHONE TREE in a few days for your results...    Dial [id number] & when prompted enter your patient number followed by the # symbol...    Your patient number is:  [id number]  Call for any questions...  Let's continue our 6 month check ups & call sooner if needed for problems.Marland KitchenMarland Kitchen
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
## 2010-07-08 LAB — LDL CHOLESTEROL, DIRECT: Direct LDL: 158.4 mg/dL

## 2010-07-08 NOTE — Telephone Encounter (Signed)
PA requested and form received. This has been placed on SN cart for review and signature. Pt will be out of this medication today per daughter, Antonio Marshall.

## 2010-07-10 ENCOUNTER — Encounter: Payer: Self-pay | Admitting: Pulmonary Disease

## 2010-07-12 ENCOUNTER — Telehealth: Payer: Self-pay | Admitting: Pulmonary Disease

## 2010-07-12 NOTE — Telephone Encounter (Signed)
PA paperwork has been filled out and faxed back to ins company. We are just waiting on ins company to let us know if this has been approved or not.  [id number]

## 2010-07-12 NOTE — Telephone Encounter (Signed)
Daughter called back I advised that our office was waiting on call back from his insurance company as far as prior approval. She wants to know what they should do for medicines until they receive PA because he is out of his medicine. She can be reached at [phone].Roswell Nickel

## 2010-07-12 NOTE — Telephone Encounter (Signed)
Spoke with Daughter and she is aware we are awaiting approval from ins. She verbalized understanding. Will forward to Leigh to keep an eye out for approval/denial.

## 2010-07-15 ENCOUNTER — Other Ambulatory Visit: Payer: Self-pay | Admitting: Adult Health

## 2010-07-15 ENCOUNTER — Telehealth: Payer: Self-pay | Admitting: Pulmonary Disease

## 2010-07-15 MED ORDER — METHYLPREDNISOLONE 4 MG PO TABS: ORAL_TABLET | ORAL | Status: DC

## 2010-07-15 NOTE — Telephone Encounter (Signed)
Spoke with pt's daughter to verify msg. Rx was sent to pharm.

## 2010-07-15 NOTE — Telephone Encounter (Signed)
Called to check on the PA for the albuterol--this was denied due to the med needing to be filed under medicare part B.  Called the pharmacy to see if they could run this under part b and was informed that this has already been done.

## 2010-07-27 ENCOUNTER — Other Ambulatory Visit: Payer: Self-pay | Admitting: Pulmonary Disease

## 2010-11-01 ENCOUNTER — Other Ambulatory Visit: Payer: Self-pay | Admitting: Adult Health

## 2011-01-05 ENCOUNTER — Ambulatory Visit: Payer: Medicare Other | Admitting: Pulmonary Disease

## 2011-01-28 ENCOUNTER — Other Ambulatory Visit: Payer: Self-pay | Admitting: Adult Health

## 2011-02-03 ENCOUNTER — Other Ambulatory Visit: Payer: Self-pay | Admitting: Pulmonary Disease

## 2011-02-08 ENCOUNTER — Ambulatory Visit (INDEPENDENT_AMBULATORY_CARE_PROVIDER_SITE_OTHER): Payer: Medicare Other | Admitting: Pulmonary Disease

## 2011-02-08 ENCOUNTER — Telehealth: Payer: Self-pay | Admitting: Pulmonary Disease

## 2011-02-08 ENCOUNTER — Encounter: Payer: Self-pay | Admitting: Pulmonary Disease

## 2011-02-08 ENCOUNTER — Ambulatory Visit (INDEPENDENT_AMBULATORY_CARE_PROVIDER_SITE_OTHER)
Admission: RE | Admit: 2011-02-08 | Discharge: 2011-02-08 | Disposition: A | Discharge status: Home / Self Care | Payer: Medicare Other | Source: Ambulatory Visit | Attending: Pulmonary Disease | Admitting: Pulmonary Disease

## 2011-02-08 DIAGNOSIS — Z23 Encounter for immunization: Secondary | ICD-10-CM

## 2011-02-08 DIAGNOSIS — F1010 Alcohol abuse, uncomplicated: Secondary | ICD-10-CM

## 2011-02-08 DIAGNOSIS — J449 Chronic obstructive pulmonary disease, unspecified: Secondary | ICD-10-CM

## 2011-02-08 DIAGNOSIS — F17200 Nicotine dependence, unspecified, uncomplicated: Secondary | ICD-10-CM

## 2011-02-08 DIAGNOSIS — J4489 Other specified chronic obstructive pulmonary disease: Secondary | ICD-10-CM

## 2011-02-08 DIAGNOSIS — R269 Unspecified abnormalities of gait and mobility: Secondary | ICD-10-CM

## 2011-02-08 DIAGNOSIS — E7800 Pure hypercholesterolemia, unspecified: Secondary | ICD-10-CM

## 2011-02-08 DIAGNOSIS — I739 Peripheral vascular disease, unspecified: Secondary | ICD-10-CM

## 2011-02-08 MED ORDER — HYDROCODONE-ACETAMINOPHEN 5-500 MG PO TABS: ORAL_TABLET | ORAL | Status: DC

## 2011-02-08 NOTE — Telephone Encounter (Signed)
I have called this into pharmacy and will make pt aware when they come in for OV.

## 2011-02-08 NOTE — Patient Instructions (Signed)
Today we updated your med list in our EPIC system...    Continue your current medications the same...  Today we did your follow up pulm function test & CXR...    Please call the PHONE TREE in a few days for your results...    Dial [id number] & when prompted enter your patient number followed by the # symbol...    Your patient number is:  [id number]#  [place]acon, you need to QUIT the last of the beers and cigarettes!!!  Increase your exercise program to build your stamina; consider the Y programs etc...  We gave you the 2012 FLU vaccine today...  It is ok to proceed w/ the planned right eye cataract surg...  Call for any questions...  Let's plan a follow up visit in 6 months.Marland KitchenMarland Kitchen
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
## 2011-02-08 NOTE — Telephone Encounter (Signed)
Daughter called bac but says since pt is coming in to be seen she will check on this when she's here. Antonio Marshall

## 2011-02-08 NOTE — Telephone Encounter (Signed)
Pt requesting a refill on vicodin 5/500. Take 1 tablet 4-6 hours prn pain NOT TO EXCEED 3 A DAY  [facility]  Allergies  Allergen Reactions  . Triple Antibiotic     DR NADEL IS THIS OK TO REFILL .THANKS.

## 2011-02-10 ENCOUNTER — Other Ambulatory Visit: Payer: Self-pay | Admitting: Pulmonary Disease

## 2011-02-10 DIAGNOSIS — R9389 Abnormal findings on diagnostic imaging of other specified body structures: Secondary | ICD-10-CM

## 2011-02-10 DIAGNOSIS — J449 Chronic obstructive pulmonary disease, unspecified: Secondary | ICD-10-CM

## 2011-02-14 ENCOUNTER — Other Ambulatory Visit (INDEPENDENT_AMBULATORY_CARE_PROVIDER_SITE_OTHER): Payer: Medicare Other

## 2011-02-14 DIAGNOSIS — J449 Chronic obstructive pulmonary disease, unspecified: Secondary | ICD-10-CM

## 2011-02-14 LAB — BASIC METABOLIC PANEL
BUN: 27 mg/dL — ABNORMAL HIGH (ref 6–23)
CO2: 26 mEq/L (ref 19–32)
Calcium: 9.2 mg/dL (ref 8.4–10.5)
Glucose, Bld: 83 mg/dL (ref 70–99)
Sodium: 137 mEq/L (ref 135–145)

## 2011-02-16 ENCOUNTER — Ambulatory Visit (INDEPENDENT_AMBULATORY_CARE_PROVIDER_SITE_OTHER)
Admission: RE | Admit: 2011-02-16 | Discharge: 2011-02-16 | Disposition: A | Discharge status: Home / Self Care | Payer: Medicare Other | Source: Ambulatory Visit | Attending: Pulmonary Disease | Admitting: Pulmonary Disease

## 2011-02-16 DIAGNOSIS — R918 Other nonspecific abnormal finding of lung field: Secondary | ICD-10-CM

## 2011-02-16 DIAGNOSIS — R9389 Abnormal findings on diagnostic imaging of other specified body structures: Secondary | ICD-10-CM

## 2011-02-16 MED ORDER — IOHEXOL 300 MG/ML  SOLN
80.0000 mL | Freq: Once | INTRAMUSCULAR | Status: AC | PRN
  Administered 2011-02-16: 80 mL via INTRAVENOUS

## 2011-02-17 ENCOUNTER — Telehealth: Payer: Self-pay | Admitting: Pulmonary Disease

## 2011-02-17 NOTE — Telephone Encounter (Signed)
Lm for donna to call me back about Coreon's results.

## 2011-02-17 NOTE — Telephone Encounter (Signed)
Requesting CT results, please advise thanks.

## 2011-02-20 NOTE — Telephone Encounter (Signed)
Antonio Marshall advised of results.Carron Curie, CMA

## 2011-02-28 ENCOUNTER — Telehealth: Payer: Self-pay | Admitting: *Deleted

## 2011-02-28 NOTE — Telephone Encounter (Signed)
Have recived fax and filled out and placed in SN look at for review. Will hold in leigh's box s once done it can be faxed back

## 2011-02-28 NOTE — Telephone Encounter (Signed)
I have called [phone] to initiate PA for ipratropium solution. I was advised this is on the non formulary now and will need to fill out the non formulary exception form and fax back to them. She is faxing the form over now. Will await until form comes through

## 2011-03-13 MED ORDER — IPRATROPIUM BROMIDE 0.02 % IN SOLN: RESPIRATORY_TRACT | Status: DC

## 2011-03-13 NOTE — Telephone Encounter (Signed)
Forms received back from the [facility]/insurance forms stating that the ipratropium has to filed under medicare part b.  Called the local pharmacy and they stated that this has already been done and the pt has already picked up his meds the end of January.

## 2011-05-12 ENCOUNTER — Other Ambulatory Visit: Payer: Self-pay | Admitting: Pulmonary Disease

## 2011-05-13 ENCOUNTER — Other Ambulatory Visit: Payer: Self-pay | Admitting: Pulmonary Disease

## 2011-05-16 ENCOUNTER — Emergency Department (HOSPITAL_COMMUNITY)
Admission: EM | Admit: 2011-05-16 | Discharge: 2011-05-16 | Disposition: A | Discharge status: Home / Self Care | Payer: Medicare Other | Attending: Emergency Medicine | Admitting: Emergency Medicine

## 2011-05-16 ENCOUNTER — Emergency Department (HOSPITAL_COMMUNITY): Payer: Medicare Other

## 2011-05-16 ENCOUNTER — Other Ambulatory Visit: Payer: Self-pay | Admitting: Pulmonary Disease

## 2011-05-16 DIAGNOSIS — J449 Chronic obstructive pulmonary disease, unspecified: Secondary | ICD-10-CM | POA: Insufficient documentation

## 2011-05-16 DIAGNOSIS — S0990XA Unspecified injury of head, initial encounter: Secondary | ICD-10-CM | POA: Insufficient documentation

## 2011-05-16 DIAGNOSIS — I739 Peripheral vascular disease, unspecified: Secondary | ICD-10-CM | POA: Insufficient documentation

## 2011-05-16 DIAGNOSIS — IMO0002 Reserved for concepts with insufficient information to code with codable children: Secondary | ICD-10-CM

## 2011-05-16 DIAGNOSIS — Z79899 Other long term (current) drug therapy: Secondary | ICD-10-CM | POA: Insufficient documentation

## 2011-05-16 DIAGNOSIS — R109 Unspecified abdominal pain: Secondary | ICD-10-CM | POA: Insufficient documentation

## 2011-05-16 DIAGNOSIS — F1020 Alcohol dependence, uncomplicated: Secondary | ICD-10-CM | POA: Insufficient documentation

## 2011-05-16 DIAGNOSIS — T07XXXA Unspecified multiple injuries, initial encounter: Secondary | ICD-10-CM | POA: Insufficient documentation

## 2011-05-16 DIAGNOSIS — R4182 Altered mental status, unspecified: Secondary | ICD-10-CM | POA: Insufficient documentation

## 2011-05-16 DIAGNOSIS — T148XXA Other injury of unspecified body region, initial encounter: Secondary | ICD-10-CM | POA: Insufficient documentation

## 2011-05-16 DIAGNOSIS — J4489 Other specified chronic obstructive pulmonary disease: Secondary | ICD-10-CM | POA: Insufficient documentation

## 2011-05-16 DIAGNOSIS — R079 Chest pain, unspecified: Secondary | ICD-10-CM | POA: Insufficient documentation

## 2011-05-16 DIAGNOSIS — W19XXXA Unspecified fall, initial encounter: Secondary | ICD-10-CM | POA: Insufficient documentation

## 2011-05-16 LAB — DIFFERENTIAL
Basophils Absolute: 0 10*3/uL (ref 0.0–0.1)
Basophils Relative: 0 % (ref 0–1)
Eosinophils Absolute: 0 10*3/uL (ref 0.0–0.7)
Eosinophils Relative: 0 % (ref 0–5)
Lymphs Abs: 0.7 10*3/uL (ref 0.7–4.0)
Neutrophils Relative %: 87 % — ABNORMAL HIGH (ref 43–77)

## 2011-05-16 LAB — COMPREHENSIVE METABOLIC PANEL
ALT: 23 U/L (ref 0–53)
AST: 72 U/L — ABNORMAL HIGH (ref 0–37)
Albumin: 3.9 g/dL (ref 3.5–5.2)
Alkaline Phosphatase: 43 U/L (ref 39–117)
Calcium: 9.6 mg/dL (ref 8.4–10.5)
Glucose, Bld: 101 mg/dL — ABNORMAL HIGH (ref 70–99)
Potassium: 5.2 mEq/L — ABNORMAL HIGH (ref 3.5–5.1)
Sodium: 131 mEq/L — ABNORMAL LOW (ref 135–145)
Total Protein: 7.5 g/dL (ref 6.0–8.3)

## 2011-05-16 LAB — CBC
MCH: 29.8 pg (ref 26.0–34.0)
Platelets: 173 10*3/uL (ref 150–400)
RBC: 4.73 MIL/uL (ref 4.22–5.81)
RDW: 13.6 % (ref 11.5–15.5)

## 2011-05-16 LAB — PROTIME-INR: INR: 1 (ref 0.00–1.49)

## 2011-05-16 LAB — APTT: aPTT: 26 seconds (ref 24–37)

## 2011-05-16 MED ORDER — IOHEXOL 300 MG/ML  SOLN
100.0000 mL | Freq: Once | INTRAMUSCULAR | Status: AC | PRN
  Administered 2011-05-16: 100 mL via INTRAVENOUS

## 2011-05-16 MED ORDER — ALBUTEROL SULFATE (5 MG/ML) 0.5% IN NEBU
5.0000 mg | INHALATION_SOLUTION | Freq: Once | RESPIRATORY_TRACT | Status: AC
  Administered 2011-05-16: 5 mg via RESPIRATORY_TRACT
  Filled 2011-05-16: qty 1

## 2011-05-16 MED ORDER — IPRATROPIUM BROMIDE 0.02 % IN SOLN
0.5000 mg | Freq: Once | RESPIRATORY_TRACT | Status: AC
  Administered 2011-05-16: 0.5 mg via RESPIRATORY_TRACT
  Filled 2011-05-16: qty 2.5

## 2011-05-16 MED ORDER — MUPIROCIN CALCIUM 2 % EX CREA: TOPICAL_CREAM | Freq: Three times a day (TID) | CUTANEOUS | Status: DC

## 2011-05-16 NOTE — Discharge Instructions (Signed)
Please read and follow all provided instructions.  Your diagnoses today include:  1. Fall   2. Head injury   3. Multiple contusions   4. Skin tear   5. Alcoholism     Tests performed today include:  CT head/chest/abd/pelvis - negative for serious injury There is a small nodule noted that needs to be followed by your doctor  Blood tests - not concerning for serious problems  Vital signs. See below for your results today.   Medications prescribed:   None  Home care instructions:  Follow any educational materials contained in this packet.  Follow-up instructions: Double water intake for the next three days.   Please follow-up with your primary care provider in the next 3 days for further evaluation of your symptoms. If you do not have a primary care doctor -- see below for referral information.   Return instructions:   Please return to the Emergency Department if you experience worsening symptoms.   Please return if you have any other emergent concerns.  Additional Information:  Your vital signs today were: BP 139/80  Pulse 85  Temp(Src) 97.5 F (36.4 C) (Oral)  Resp 21  SpO2 97% If your blood pressure (BP) was elevated above 135/85 this visit, please have this repeated by your doctor within one month. -------------- No Primary Care Doctor Call [facility]  [phone] Other agencies that provide inexpensive medical care    [facility]  [phone]    [facility]  [phone]    [facility]  [phone]    [facility]  [phone]    [facility]  [phone]    [facility]  [phone] -------------- RESOURCE GUIDE:  Dental Problems  Patients with Medicaid: [facility] [phone] [address]                                            [phone] [address] Phone:  [phone]                                                   Phone:  [phone]  If unable to pay or  uninsured, contact:  [facility] or [facility]. to become qualified for the adult dental clinic.  Chronic Pain Problems Contact [facility]  [phone] Patients need to be referred by their primary care doctor.  Insufficient Money for Medicine Contact [employer]:  call "211" or [facility] [phone].  Psychological Services [facility]  [phone] [facility]  [phone] [facility]   [phone] (emergency services [phone])  Substance Abuse Resources [facility]  [phone] [facility] [phone] [facility] [place] [phone] Floydene Flock [phone] Residential & Outpatient Substance Abuse Program  [phone]  Abuse/Neglect [facility] Child Abuse Hotline 5731287654 [facility] Child Abuse Hotline 3407012905 (After Hours)  Emergency Shelter [facility] [phone]  Maternity Homes Room at the [place] [facility] [phone] [place] [facility] [phone]  [facility]  [place] Dept. [address]      [place] Phone:  [phone]                                   Phone:  [phone]                 Phone:  [place] Phone:  [place] [phone] (After Hours)

## 2011-05-16 NOTE — ED Notes (Signed)
Per EMS, patient was found by family after a fall.  Patient was lying on his L side when they found him.  When EMS arrived, patient was sitting and family was at side.  EMS advised that patient has bruising on L shoulder, with skins tears on bilateral upper extremities.  Patient also has bruising on L ribcage.  EMS advised that patient had fallen around 0600 this a.m., but was not found until around noon today.  Per patient, he had been drinking and fell.  Patient states 50 year drinking history and advises of drinking approximately 10 - 12 beers daily.  Patients granddaughter at bedside with patient upon arrival with EMS.

## 2011-05-16 NOTE — ED Provider Notes (Signed)
History     CSN: [id number]  Arrival date & time 05/16/11  1307   First MD Initiated Contact with Patient 05/16/11 1330      Chief Complaint  Patient presents with  . Fall    (Consider location/radiation/quality/duration/timing/severity/associated sxs/prior treatment) HPI Comments: 71 yo male with history of etoh abuse x 50 years and multiple recent falls, presents accompanied by his family via EMS for a fall that occurred at approximately 6AM this morning.  Patient states that he has problems with balance and that this morning he fell, hitting the front of his head on the floor and injuring his left side. He was lying on the floor on his left side for approximately 6 hours before being found by his granddaughter.  Currently, patient complains of left rib pain and left abdominal pain.  Patient is not currently on any anticoagulants, but does drink 7-8 beers/day. Family reports multiple bruises on the left side, along with "weeping" skin tears. Family also reports mental status change from normal, stating that he is very "spacy."  No N/V, vision loss. Denies neck pain worse than baseline.   Patient is a 71 y.o. male presenting with fall. The history is provided by the patient.  Fall The accident occurred 6 to 12 hours ago. The fall occurred while walking. He landed on a hard floor. Associated symptoms include abdominal pain. Pertinent negatives include no fever, no nausea, no vomiting and no headaches.    Past Medical History  Diagnosis Date  . Bilateral senile cataracts   . Macular degeneration, bilateral   . COPD (chronic obstructive pulmonary disease)   . Cigarette smoker   . CAD (coronary artery disease)     ?  Marland Kitchen Peripheral vascular disease   . Alcohol abuse   . Head trauma     closed  . Neuropathy   . Abnormality of gait   . Anxiety     Past Surgical History  Procedure Date  . Appendectomy   . Brain surgery 1982    for [facility]     Family History  Problem Relation Age of  Onset  . Asthma Brother   . Asthma Brother   . Asthma Brother   . Emphysema Brother   . Diabetes Mother   . Arthritis Mother   . Heart disease Mother   . Diabetes Brother   . Breast cancer Sister     History  Substance Use Topics  . Smoking status: Current Everyday Smoker -- 0.5 packs/day for 52 years    Types: Cigarettes  . Smokeless tobacco: Never Used   Comment: 1 pack every 2 days  . Alcohol Use: 2.0 oz/week    4 drink(s) per week      Review of Systems  Constitutional: Negative for fever.  HENT: Negative for sore throat, rhinorrhea, neck pain and tinnitus.   Eyes: Negative for visual disturbance.  Respiratory: Negative for cough and shortness of breath.   Cardiovascular: Negative for chest pain.  Gastrointestinal: Positive for abdominal pain. Negative for nausea and vomiting.  Genitourinary: Negative for dysuria.  Musculoskeletal: Negative for myalgias, back pain and gait problem.  Skin: Positive for color change and wound. Negative for rash.  Neurological: Negative for weakness, light-headedness and headaches.  Psychiatric/Behavioral: Negative for confusion.    Allergies  Triple antibiotic  Home Medications   Current Outpatient Rx  Name Route Sig Dispense Refill  . ADVAIR HFA 115-21 MCG/ACT IN AERO  INHALE 2 PUFFS BY MOUTH TWICE A DAY 12 g  0  . ALBUTEROL SULFATE 0.63 MG/3ML IN NEBU  USE 1 VIAL IN NEBULIZER UP TO 4 TIMES DAILY AS NEEDED. 300 mL 5  . VITAMIN B COMPLEX PO Oral Take by mouth. Once a day     . VITAMIN D 1000 UNITS PO TABS Oral Take 1,000 Units by mouth daily.      . ENALAPRIL MALEATE 5 MG PO TABS  TAKE 1 TABLET BY MOUTH ONCE DAILY 30 tablet 4  . HYDROCODONE-ACETAMINOPHEN 5-500 MG PO TABS  Take 1 tablet by mouth every 4-6 hours as needed for pain. (DO NOT EXCEED 3 TABLETS PER DAY) 90 tablet 5  . IPRATROPIUM BROMIDE 0.02 % IN SOLN  USE 1 VIAL IN NEBULIZER 4 TIMES DAILY, 250 mL 5  . METHYLPREDNISOLONE 4 MG PO TABS  1/2 tab once a day 15 tablet 11  .  MULTIVITAMINS PO Oral Take by mouth. Once a day     . SERTRALINE HCL 100 MG PO TABS  take 1 tablet by mouth once daily 30 tablet 5    BP 102/77  Pulse 84  Temp(Src) 97.5 F (36.4 C) (Oral)  Resp 16  SpO2 96%  Physical Exam  Nursing note and vitals reviewed. Constitutional: He is oriented to person, place, and time. He appears well-developed and well-nourished.  HENT:  Head: Normocephalic. Head is without raccoon's eyes and without Battle's sign.  Right Ear: Tympanic membrane, external ear and ear canal normal. No hemotympanum.  Left Ear: Tympanic membrane, external ear and ear canal normal. No hemotympanum.  Nose: Nose normal.  Mouth/Throat: Oropharynx is clear and moist.       Mild right frontal hematoma  Eyes: Conjunctivae, EOM and lids are normal. Pupils are equal, round, and reactive to light.       No visible hyphema  Neck: Normal range of motion. Neck supple. No JVD present.  Cardiovascular: Normal rate and regular rhythm.   No murmur heard. Pulses:      Radial pulses are 2+ on the right side, and 2+ on the left side.  Pulmonary/Chest: Effort normal and breath sounds normal.       Brusing, swelling, and generalized tenderness of L lateral ribs  Abdominal: Soft. Bowel sounds are normal. He exhibits no distension. There is tenderness. There is no rebound and no guarding.       Bruising of L upper abdomen  Musculoskeletal: Normal range of motion.       Cervical back: He exhibits normal range of motion, no tenderness and no bony tenderness.       Thoracic back: He exhibits no tenderness and no bony tenderness.       Lumbar back: He exhibits no tenderness and no bony tenderness.       There are multiple large areas of purple bruising noted L lateral shoulder, upper arm, upper thigh with associated skin tears. Mild serous drainage from larger tears. Patient has good passive ROM L fingers, hand, wrist, elbow, ankle, knee, and hip. 2+ pulses bilaterally.   Neurological: He is alert  and oriented to person, place, and time. He has normal strength and normal reflexes. No cranial nerve deficit or sensory deficit. Coordination normal. GCS eye subscore is 4. GCS verbal subscore is 5. GCS motor subscore is 6.  Skin: Skin is warm and dry. There is erythema.  Psychiatric: He has a normal mood and affect.    ED Course  Procedures (including critical care time)  Labs Reviewed - No data to display No results found.  No diagnosis found.  2:14 PM Patient seen and examined. Work-up initiated. D/w Dr. Radford Pax.   Vital signs reviewed and are as follows: Filed Vitals:   05/16/11 1404  BP: 139/80  Pulse: 85  Temp:   Resp: 21   Patient was discussed with and seen by Dr. Radford Pax.  CT scans and x-rays reviewed by myself. No concerning findings.   Patient and family informed of results. Informed of elevated CK and need to drink plenty of water. Patient does not want ETOH detox. Counseled on wound care and to rest injuries. Urged patient to follow-up with PCP in 2 days for recheck of injuries.   The patient was urged to return to the Emergency Department urgently with worsening pain, swelling, expanding erythema especially if it streaks away from the affected wounds, fever, or if they have any other concerns. Patient verbalized understanding.    MDM  Fall, scans performed are reassuring. Elevated CK with hyperkalemia. Renal function OK. Rhabdo does not appear significant. Aside from skin tears and bruising, patient appears well. Will need close monitoring and wound care. Strict return instructions given. Alcohol cessation counseling given, patient does not seem interested.         Renne Crigler, [place] 05/17/11 2134

## 2011-05-16 NOTE — ED Notes (Signed)
Was seen and examined by Felicita Gage PAC

## 2011-05-19 ENCOUNTER — Other Ambulatory Visit: Payer: Self-pay | Admitting: Pulmonary Disease

## 2011-05-19 MED ORDER — MUPIROCIN 2 % EX OINT: TOPICAL_OINTMENT | Freq: Three times a day (TID) | CUTANEOUS | Status: AC

## 2011-05-19 NOTE — ED Provider Notes (Signed)
Medical screening examination/treatment/procedure(s) were performed by non-physician practitioner and as supervising physician I was immediately available for consultation/collaboration.    Moosa Bueche L Airyana Sprunger, MD 05/19/11 2036 

## 2011-05-19 NOTE — Telephone Encounter (Signed)
Per pharmacy-insurance will not cover cream but will cover ointment; I gave verbal to change order.

## 2011-06-08 ENCOUNTER — Other Ambulatory Visit: Payer: Self-pay | Admitting: Pulmonary Disease

## 2011-07-03 ENCOUNTER — Other Ambulatory Visit: Payer: Self-pay | Admitting: Pulmonary Disease

## 2011-08-10 ENCOUNTER — Other Ambulatory Visit: Payer: Self-pay | Admitting: Pulmonary Disease

## 2011-08-11 ENCOUNTER — Telehealth: Payer: Self-pay | Admitting: Pulmonary Disease

## 2011-08-11 MED ORDER — HYDROCODONE-ACETAMINOPHEN 5-500 MG PO TABS: ORAL_TABLET | ORAL | Status: DC

## 2011-08-11 NOTE — Telephone Encounter (Signed)
Per leigh okay to refill x 0 refills until pt OV 08/14/11. i have [facility]--lm with family member for donna tcb

## 2011-08-14 ENCOUNTER — Ambulatory Visit: Payer: Medicare Other | Admitting: Pulmonary Disease

## 2011-08-25 ENCOUNTER — Other Ambulatory Visit: Payer: Self-pay | Admitting: Pulmonary Disease

## 2011-08-30 ENCOUNTER — Other Ambulatory Visit: Payer: Self-pay | Admitting: Pulmonary Disease

## 2011-08-30 MED ORDER — MUPIROCIN 2 % EX OINT: TOPICAL_OINTMENT | CUTANEOUS | Status: DC

## 2011-09-20 ENCOUNTER — Telehealth: Payer: Self-pay | Admitting: Pulmonary Disease

## 2011-09-20 MED ORDER — HYDROCODONE-ACETAMINOPHEN 5-500 MG PO TABS: ORAL_TABLET | ORAL | Status: DC

## 2011-09-20 NOTE — Telephone Encounter (Signed)
LMOM that refill for Vicodin was sent to [facility] on [address]

## 2011-09-27 ENCOUNTER — Ambulatory Visit: Payer: Medicare Other | Admitting: Pulmonary Disease

## 2011-10-05 ENCOUNTER — Other Ambulatory Visit: Payer: Self-pay | Admitting: Pulmonary Disease

## 2011-10-19 ENCOUNTER — Ambulatory Visit (INDEPENDENT_AMBULATORY_CARE_PROVIDER_SITE_OTHER): Payer: Medicare Other | Admitting: Pulmonary Disease

## 2011-10-19 ENCOUNTER — Encounter: Payer: Self-pay | Admitting: Pulmonary Disease

## 2011-10-19 VITALS — BP 118/60 | HR 58 | Temp 97.1°F | Ht 70.0 in | Wt 131.0 lb

## 2011-10-19 DIAGNOSIS — I739 Peripheral vascular disease, unspecified: Secondary | ICD-10-CM

## 2011-10-19 DIAGNOSIS — Z23 Encounter for immunization: Secondary | ICD-10-CM

## 2011-10-19 DIAGNOSIS — F1010 Alcohol abuse, uncomplicated: Secondary | ICD-10-CM

## 2011-10-19 DIAGNOSIS — E7800 Pure hypercholesterolemia, unspecified: Secondary | ICD-10-CM

## 2011-10-19 DIAGNOSIS — J4489 Other specified chronic obstructive pulmonary disease: Secondary | ICD-10-CM

## 2011-10-19 DIAGNOSIS — I872 Venous insufficiency (chronic) (peripheral): Secondary | ICD-10-CM

## 2011-10-19 DIAGNOSIS — R269 Unspecified abnormalities of gait and mobility: Secondary | ICD-10-CM

## 2011-10-19 DIAGNOSIS — J449 Chronic obstructive pulmonary disease, unspecified: Secondary | ICD-10-CM

## 2011-10-19 DIAGNOSIS — F17200 Nicotine dependence, unspecified, uncomplicated: Secondary | ICD-10-CM

## 2011-10-19 DIAGNOSIS — G589 Mononeuropathy, unspecified: Secondary | ICD-10-CM

## 2011-10-19 DIAGNOSIS — F411 Generalized anxiety disorder: Secondary | ICD-10-CM

## 2011-10-19 MED ORDER — FLUTICASONE-SALMETEROL 115-21 MCG/ACT IN AERO: 2.0000 | INHALATION_SPRAY | Freq: Two times a day (BID) | RESPIRATORY_TRACT | Status: DC

## 2011-10-19 MED ORDER — MUPIROCIN 2 % EX OINT: TOPICAL_OINTMENT | CUTANEOUS | Status: DC

## 2011-10-19 MED ORDER — METHYLPREDNISOLONE 4 MG PO TABS: ORAL_TABLET | ORAL | Status: DC

## 2011-10-19 MED ORDER — HYDROCODONE-ACETAMINOPHEN 5-500 MG PO TABS: ORAL_TABLET | ORAL | Status: DC

## 2011-10-19 MED ORDER — SERTRALINE HCL 100 MG PO TABS: 100.0000 mg | ORAL_TABLET | Freq: Every day | ORAL | Status: DC

## 2011-10-19 NOTE — Patient Instructions (Addendum)
Today we updated your med list in our EPIC system...    Continue your current medications the same...  We gave you the 2013 Flu vaccine today...  We refilled your meds per request...  Please work on smoking cessation & decreasing your alcohol consumption...  Call for any questions...  Let's plan a follw up visit w/ CXR & FASTING blood work in 6 months.Marland KitchenMarland Kitchen
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
## 2012-01-03 ENCOUNTER — Other Ambulatory Visit: Payer: Self-pay | Admitting: Pulmonary Disease

## 2012-01-05 ENCOUNTER — Other Ambulatory Visit: Payer: Self-pay | Admitting: Pulmonary Disease

## 2012-01-05 MED ORDER — ENALAPRIL MALEATE 5 MG PO TABS: 5.0000 mg | ORAL_TABLET | Freq: Every day | ORAL | Status: DC

## 2012-02-02 ENCOUNTER — Telehealth: Payer: Self-pay | Admitting: Pulmonary Disease

## 2012-02-02 NOTE — Telephone Encounter (Signed)
Fax received from the pharmacy asking for a dosage change on the Vicodin 5/500 to the 5/325. The 5/500 is no longer availabel. Per Antonio Marshall this is okay and I have called the pharmacy with this update and the medication list has also been updated. Per the pharmacist, the pt is aware the 5/500 is no longer available and will be changed.

## 2012-04-16 ENCOUNTER — Ambulatory Visit: Payer: Medicare Other | Admitting: Pulmonary Disease

## 2012-05-03 ENCOUNTER — Other Ambulatory Visit: Payer: Self-pay | Admitting: Pulmonary Disease

## 2012-05-14 ENCOUNTER — Ambulatory Visit: Payer: Medicare Other | Admitting: Pulmonary Disease

## 2012-06-05 ENCOUNTER — Other Ambulatory Visit: Payer: Self-pay | Admitting: Pulmonary Disease

## 2012-06-08 IMAGING — CT CT CHEST W/ CM
2 of 4 series · 15 of 36 positions shown, 18 images · IV contrast (Omnipaque 300)
Comparison: Chest radiograph 02/08/2011 and CT chest 06/09/2004.

CLINICAL DATA: Follow-up abnormal chest radiograph.  Left upper
lobe nodule.  Smoker, productive cough.

CT CHEST WITH CONTRAST
TECHNIQUE: Multidetector CT imaging of the chest was performed
following the standard protocol during bolus administration of
intravenous contrast.
Contrast: 80mL OMNIPAQUE IOHEXOL 300 MG/ML IV SOLN

[Series 2: chest routine with · axial · 0.73mm/px · z∈[-238,-18]mm · 12 of 54 slices shown, 15 images]
[im 5/54  mediastinal]
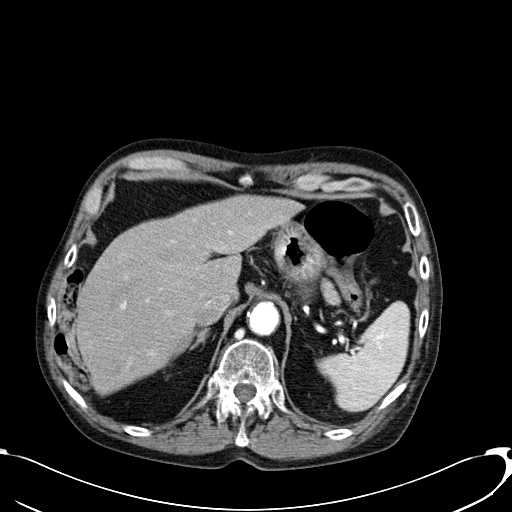
[im 5/54  lung]
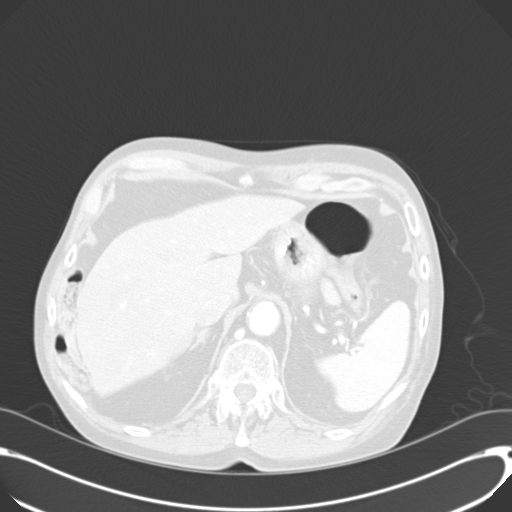
[im 9/54  lung]
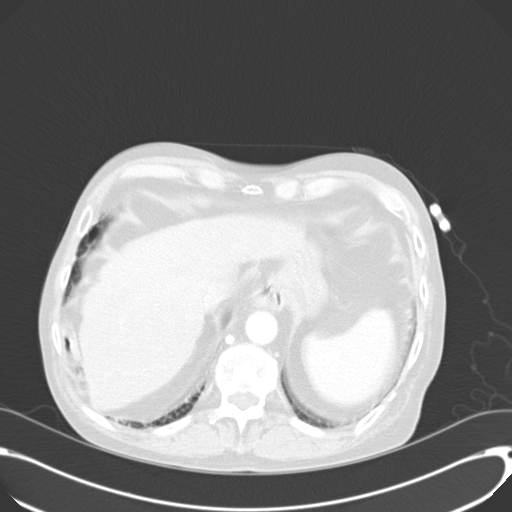
[im 13/54  lung]
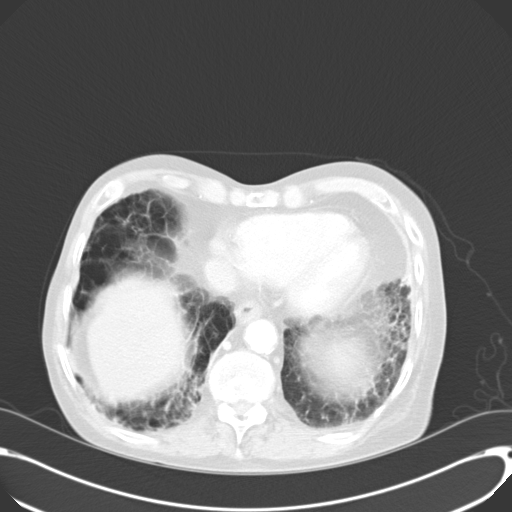
[im 17/54  lung]
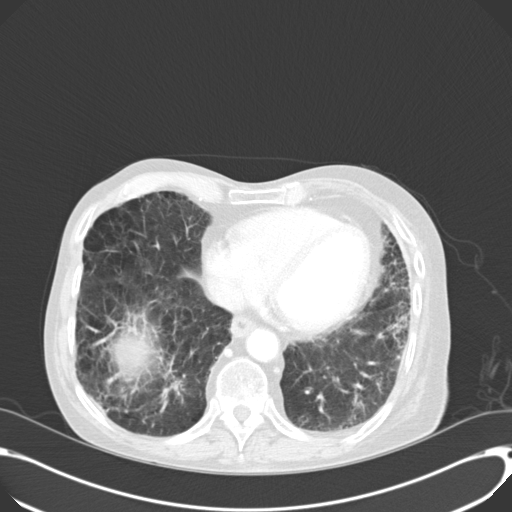
[im 21/54  mediastinal]
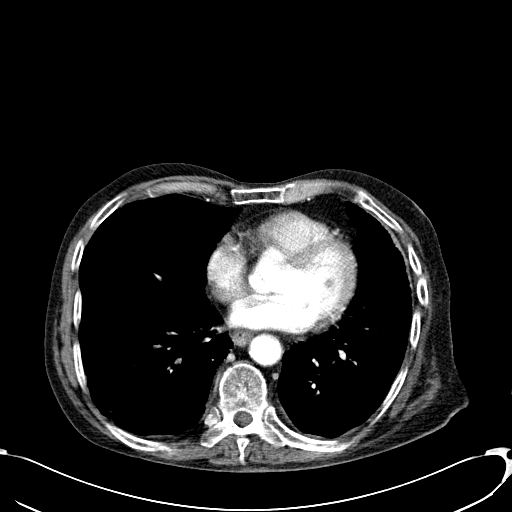
[im 21/54  lung]
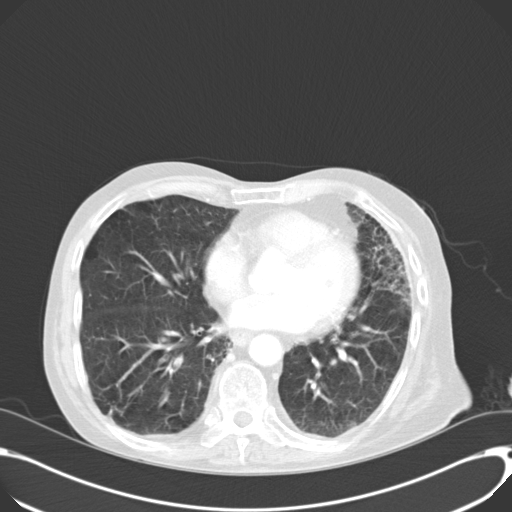
[im 25/54  lung]
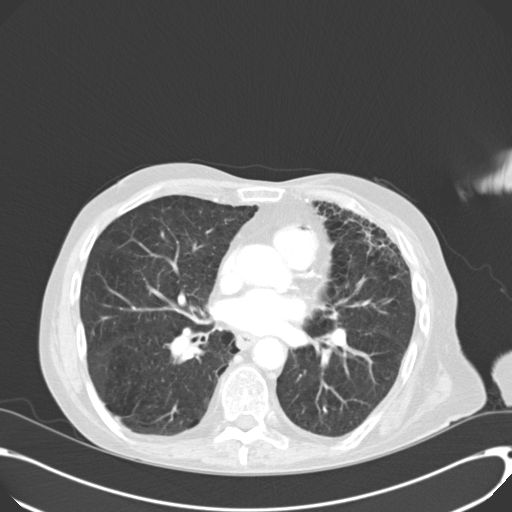
[im 29/54  lung]
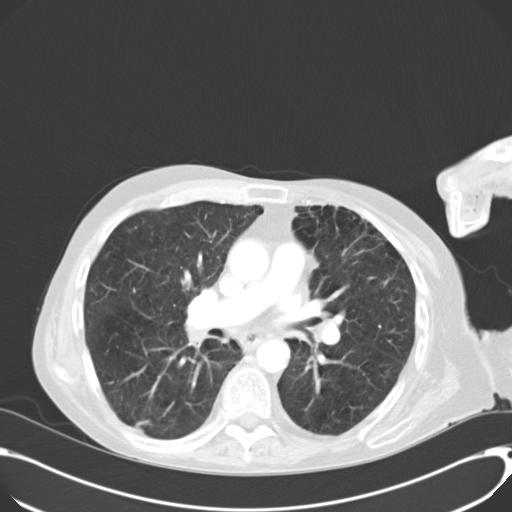
[im 33/54  lung]
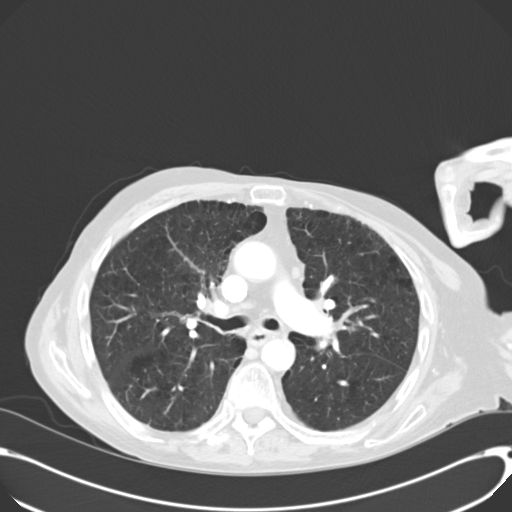
[im 37/54  mediastinal]
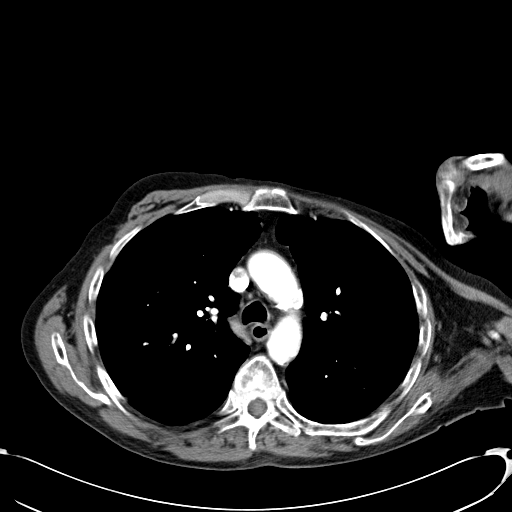
[im 37/54  lung]
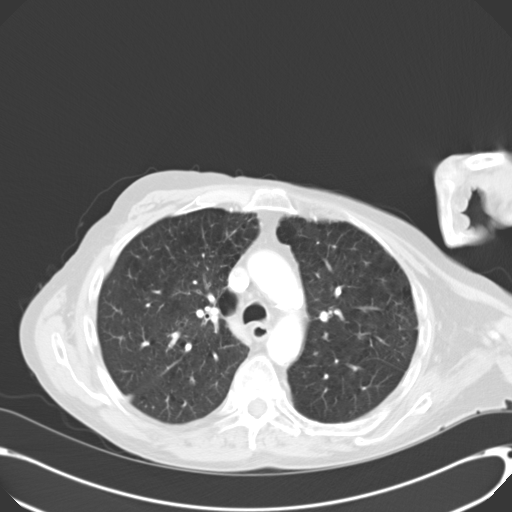
[im 41/54  lung]
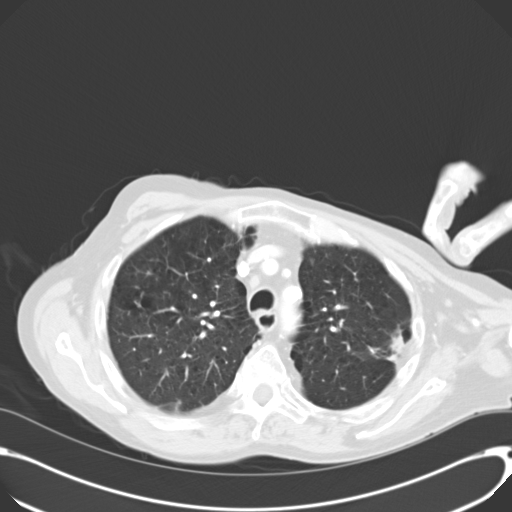
[im 45/54  lung]
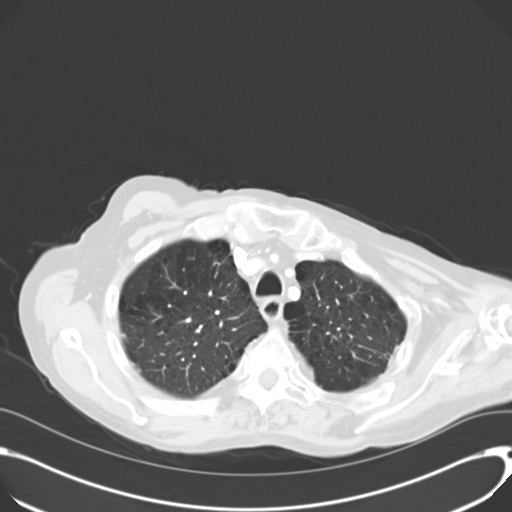
[im 49/54  lung]
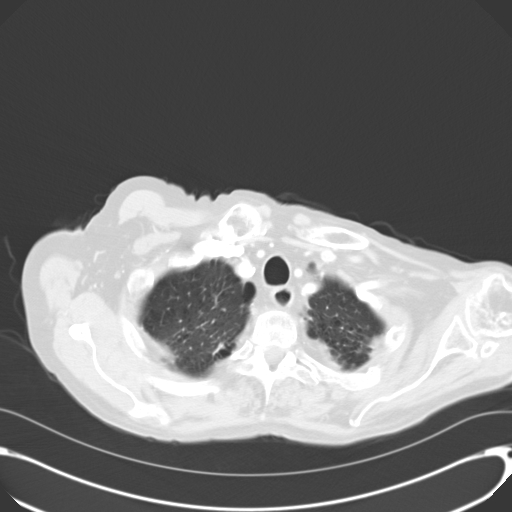

[Series 602: cor · coronal · 0.73mm/px · 3 of 95 slices shown]
[im 19/95  lung]
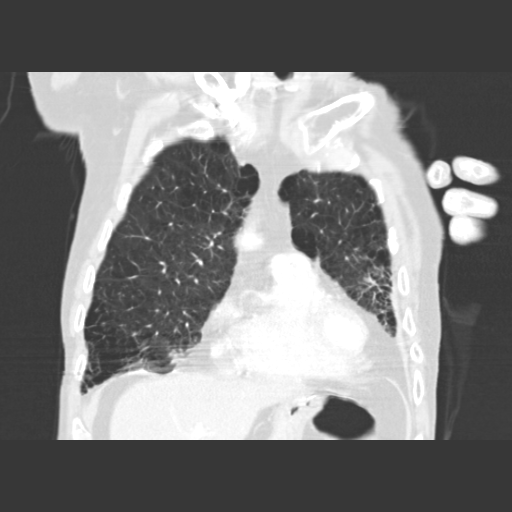
[im 38/95  lung]
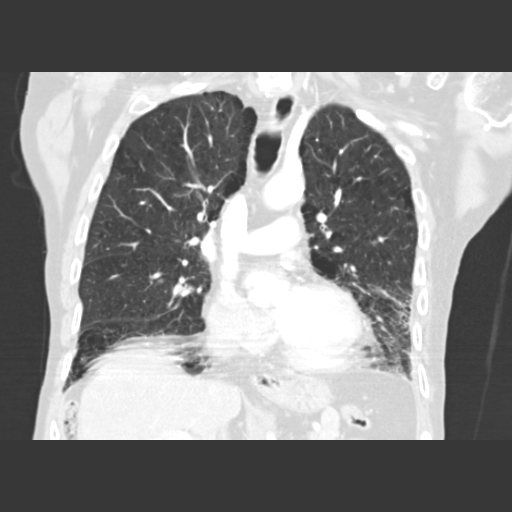
[im 57/95  lung]
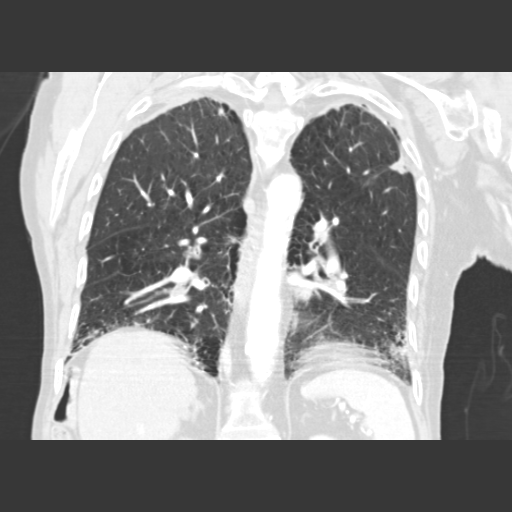

[15 of 36 positions shown; findings below may reference images not displayed]

FINDINGS: Mediastinal and hilar lymph nodes are not enlarged by CT
size criteria.  No axillary adenopathy.  Coronary artery
calcification.  Heart size normal.  No pericardial effusion.

Biapical pleural parenchymal scarring.  Emphysema.  Irregular
nodular scarring in the superior segment right lower lobe (image
25) is unchanged from 06/09/2004.  A 4 mm left lower lobe nodule
(image 35) is also stable. Two additional nodular densities are
seen in the left lower lobe, measuring 4 mm or less in size (images
33 and 39). Subpleural irregular nodular scarring in the apical
left upper lobe (image 14) appears unchanged from 06/09/2004 and
accounts for the abnormality seen on 02/08/2011.  No pleural fluid.

Incidental imaging of the upper abdomen shows an 11 mm low
attenuation nodule in the left adrenal gland, stable.  A 5 mm low
attenuation lesion in the upper pole right kidney is likely a cyst.
No worrisome lytic or sclerotic lesions.  Old left rib fracture.
Mild uniform loss of vertebral body height in the mid to lower
thoracic spine, age indeterminate.
IMPRESSION: 1.  Irregular nodular scarring in the peripheral left upper lobe
accounts for the abnormality on recent chest radiograph and appears
unchanged from 06/09/2004.
2.  Two tiny nodules in the left lower lobe are not definitely seen
on the prior study. As this patient is at high risk for
bronchogenic carcinoma, follow-up chest CT at 1 year is
recommended.  This recommendation follows the consensus statement:
Guidelines for Management of Small Pulmonary Nodules Detected on CT
Scans:  A Statement from the [HOSPITAL] as published in
[URL]
3.  Left adrenal adenoma.

## 2012-06-25 ENCOUNTER — Encounter: Payer: Self-pay | Admitting: Pulmonary Disease

## 2012-06-25 ENCOUNTER — Ambulatory Visit (INDEPENDENT_AMBULATORY_CARE_PROVIDER_SITE_OTHER): Payer: Medicare Other | Admitting: Pulmonary Disease

## 2012-06-25 ENCOUNTER — Ambulatory Visit (INDEPENDENT_AMBULATORY_CARE_PROVIDER_SITE_OTHER)
Admission: RE | Admit: 2012-06-25 | Discharge: 2012-06-25 | Disposition: A | Discharge status: Home / Self Care | Payer: Medicare Other | Source: Ambulatory Visit | Attending: Pulmonary Disease | Admitting: Pulmonary Disease

## 2012-06-25 ENCOUNTER — Other Ambulatory Visit (INDEPENDENT_AMBULATORY_CARE_PROVIDER_SITE_OTHER): Payer: Medicare Other

## 2012-06-25 VITALS — BP 98/60 | HR 88 | Temp 96.8°F | Ht 67.5 in | Wt 123.6 lb

## 2012-06-25 DIAGNOSIS — N320 Bladder-neck obstruction: Secondary | ICD-10-CM

## 2012-06-25 DIAGNOSIS — F411 Generalized anxiety disorder: Secondary | ICD-10-CM

## 2012-06-25 DIAGNOSIS — E7800 Pure hypercholesterolemia, unspecified: Secondary | ICD-10-CM

## 2012-06-25 DIAGNOSIS — J449 Chronic obstructive pulmonary disease, unspecified: Secondary | ICD-10-CM

## 2012-06-25 DIAGNOSIS — D649 Anemia, unspecified: Secondary | ICD-10-CM

## 2012-06-25 DIAGNOSIS — F17200 Nicotine dependence, unspecified, uncomplicated: Secondary | ICD-10-CM

## 2012-06-25 DIAGNOSIS — R269 Unspecified abnormalities of gait and mobility: Secondary | ICD-10-CM

## 2012-06-25 DIAGNOSIS — G589 Mononeuropathy, unspecified: Secondary | ICD-10-CM

## 2012-06-25 DIAGNOSIS — I739 Peripheral vascular disease, unspecified: Secondary | ICD-10-CM

## 2012-06-25 DIAGNOSIS — J4489 Other specified chronic obstructive pulmonary disease: Secondary | ICD-10-CM

## 2012-06-25 DIAGNOSIS — F1010 Alcohol abuse, uncomplicated: Secondary | ICD-10-CM

## 2012-06-25 LAB — IBC PANEL
Iron: 77 ug/dL (ref 42–165)
Saturation Ratios: 23.1 % (ref 20.0–50.0)
Transferrin: 238 mg/dL (ref 212.0–360.0)

## 2012-06-25 LAB — HEPATIC FUNCTION PANEL
ALT: 12 U/L (ref 0–53)
AST: 17 U/L (ref 0–37)
Albumin: 4.2 g/dL (ref 3.5–5.2)
Total Protein: 7.7 g/dL (ref 6.0–8.3)

## 2012-06-25 LAB — BASIC METABOLIC PANEL
BUN: 26 mg/dL — ABNORMAL HIGH (ref 6–23)
GFR: 53.67 mL/min — ABNORMAL LOW (ref >60.00)
Glucose, Bld: 81 mg/dL (ref 70–99)
Potassium: 5.8 mEq/L — ABNORMAL HIGH (ref 3.5–5.1)

## 2012-06-25 LAB — TSH: TSH: 0.69 u[IU]/mL (ref 0.35–5.50)

## 2012-06-25 LAB — CBC WITH DIFFERENTIAL/PLATELET
Eosinophils Relative: 3.6 % (ref 0.0–5.0)
HCT: 43.1 % (ref 39.0–52.0)
Lymphocytes Relative: 14.7 % (ref 12.0–46.0)
Monocytes Relative: 5.6 % (ref 3.0–12.0)
Neutrophils Relative %: 75.8 % (ref 43.0–77.0)
Platelets: 225 10*3/uL (ref 150.0–400.0)
WBC: 12.6 10*3/uL — ABNORMAL HIGH (ref 4.5–10.5)

## 2012-06-25 MED ORDER — IPRATROPIUM BROMIDE 0.02 % IN SOLN: RESPIRATORY_TRACT | Status: DC

## 2012-06-25 MED ORDER — ALBUTEROL SULFATE 0.63 MG/3ML IN NEBU: INHALATION_SOLUTION | RESPIRATORY_TRACT | Status: DC

## 2012-06-25 MED ORDER — SERTRALINE HCL 100 MG PO TABS: 100.0000 mg | ORAL_TABLET | Freq: Every day | ORAL | Status: DC

## 2012-06-25 MED ORDER — ENALAPRIL MALEATE 5 MG PO TABS: 5.0000 mg | ORAL_TABLET | Freq: Every day | ORAL | Status: DC

## 2012-06-25 MED ORDER — METHYLPREDNISOLONE 4 MG PO TABS: 4.0000 mg | ORAL_TABLET | Freq: Every day | ORAL | Status: DC

## 2012-06-25 MED ORDER — HYDROCODONE-ACETAMINOPHEN 5-325 MG PO TABS: ORAL_TABLET | ORAL | Status: DC

## 2012-06-25 NOTE — Patient Instructions (Addendum)
Today we updated your med list in our EPIC system...    Continue your current medications the same...    We refilled the meds you requested...  Congrats on decreasing the tobacco & alcohol...    This is a step in the right direction but you MUST go further=> you need to QUIT!!!  Today we did your follow up CXR, Breathjing Test, Oxygen check, & Blood work...    We will contact you w/ the results when available...   In the meanwhile>    Use the full neb treatments 4 times daily...    Increase the Medrol to one 4mg  tab each AM...  Improve your nutrition by eating a proper diet 7 nutrtional supplement like Ensure, Boost, etc...  Let's plan a follow up visit in 4-31mo, sooner if needed for problems.Marland KitchenMarland Kitchen
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
## 2012-08-19 ENCOUNTER — Telehealth: Payer: Self-pay | Admitting: Pulmonary Disease

## 2012-08-19 DIAGNOSIS — J449 Chronic obstructive pulmonary disease, unspecified: Secondary | ICD-10-CM

## 2012-08-20 NOTE — Telephone Encounter (Signed)
Spoke with daughter [name] meds were recently filled Neb machine is just needed to [facility]; current machine has malfunctioned Ordered placed [name] is aware

## 2012-10-02 ENCOUNTER — Other Ambulatory Visit: Payer: Self-pay | Admitting: Pulmonary Disease

## 2012-10-29 ENCOUNTER — Ambulatory Visit: Payer: Medicare Other | Admitting: Pulmonary Disease

## 2012-10-29 ENCOUNTER — Telehealth: Payer: Self-pay | Admitting: Pulmonary Disease

## 2012-10-29 MED ORDER — HYDROCODONE-ACETAMINOPHEN 5-325 MG PO TABS: ORAL_TABLET | ORAL | Status: DC

## 2012-10-29 NOTE — Telephone Encounter (Signed)
Called and spoke with donna and she is aware of that the rx for the vicodin has been called to the pharmacy.  She is aware that any other rx for the vicodin will have to be picked up from our office and no refills can be given.

## 2012-10-29 NOTE — Telephone Encounter (Addendum)
Pt cancelled appt for today and resceduled for 12/25/12.    He is needing a refill of the vicodin.  SN please advise if ok to send in.  Last ov with SN  05/2012.

## 2012-10-31 ENCOUNTER — Other Ambulatory Visit: Payer: Self-pay | Admitting: Pulmonary Disease

## 2012-12-02 ENCOUNTER — Telehealth: Payer: Self-pay | Admitting: Pulmonary Disease

## 2012-12-02 NOTE — Telephone Encounter (Signed)
I called [facility] and spoke with [facility]. I was advised pt last had this filled 10/30/12. Pt pending appt 12/25/12 Please advise SN thanks

## 2012-12-03 MED ORDER — HYDROCODONE-ACETAMINOPHEN 5-325 MG PO TABS: ORAL_TABLET | ORAL | Status: DC

## 2012-12-03 NOTE — Telephone Encounter (Signed)
rx signed and i have left this up front to be picked up.  i called donna and lmom to make her aware.

## 2012-12-03 NOTE — Telephone Encounter (Signed)
rx printed out and placed on SN cart to be signed.   

## 2012-12-10 ENCOUNTER — Encounter (HOSPITAL_COMMUNITY): Payer: Self-pay | Admitting: Emergency Medicine

## 2012-12-10 DIAGNOSIS — J441 Chronic obstructive pulmonary disease with (acute) exacerbation: Secondary | ICD-10-CM | POA: Diagnosis present

## 2012-12-10 DIAGNOSIS — H259 Unspecified age-related cataract: Secondary | ICD-10-CM | POA: Diagnosis present

## 2012-12-10 DIAGNOSIS — E785 Hyperlipidemia, unspecified: Secondary | ICD-10-CM | POA: Diagnosis present

## 2012-12-10 DIAGNOSIS — R269 Unspecified abnormalities of gait and mobility: Secondary | ICD-10-CM | POA: Diagnosis present

## 2012-12-10 DIAGNOSIS — F1011 Alcohol abuse, in remission: Secondary | ICD-10-CM | POA: Diagnosis present

## 2012-12-10 DIAGNOSIS — I2510 Atherosclerotic heart disease of native coronary artery without angina pectoris: Secondary | ICD-10-CM | POA: Diagnosis present

## 2012-12-10 DIAGNOSIS — Z9181 History of falling: Secondary | ICD-10-CM

## 2012-12-10 DIAGNOSIS — I739 Peripheral vascular disease, unspecified: Secondary | ICD-10-CM | POA: Diagnosis present

## 2012-12-10 DIAGNOSIS — Z87891 Personal history of nicotine dependence: Secondary | ICD-10-CM

## 2012-12-10 DIAGNOSIS — G589 Mononeuropathy, unspecified: Secondary | ICD-10-CM | POA: Diagnosis present

## 2012-12-10 DIAGNOSIS — J189 Pneumonia, unspecified organism: Principal | ICD-10-CM | POA: Diagnosis present

## 2012-12-10 DIAGNOSIS — Z87820 Personal history of traumatic brain injury: Secondary | ICD-10-CM

## 2012-12-10 DIAGNOSIS — H3530 Unspecified macular degeneration: Secondary | ICD-10-CM | POA: Diagnosis present

## 2012-12-10 LAB — CBC WITH DIFFERENTIAL/PLATELET
Eosinophils Relative: 0 % (ref 0–5)
Hemoglobin: 13.1 g/dL (ref 13.0–17.0)
Lymphocytes Relative: 8 % — ABNORMAL LOW (ref 12–46)
Lymphs Abs: 1 10*3/uL (ref 0.7–4.0)
MCH: 29.4 pg (ref 26.0–34.0)
MCV: 85.4 fL (ref 78.0–100.0)
Monocytes Relative: 9 % (ref 3–12)
Platelets: 191 10*3/uL (ref 150–400)
RBC: 4.45 MIL/uL (ref 4.22–5.81)
WBC: 11.4 10*3/uL — ABNORMAL HIGH (ref 4.0–10.5)

## 2012-12-10 NOTE — ED Notes (Signed)
Pt.'s family reported that pt. has been falling multiple times since last night / unsteady gait , presents with skin tear at left lateral upper arm - no bleeding. Pt. denies pain , oriented to place and person . Respirations unlabored .

## 2012-12-11 ENCOUNTER — Inpatient Hospital Stay (HOSPITAL_COMMUNITY): Payer: Medicare Other

## 2012-12-11 ENCOUNTER — Encounter (HOSPITAL_COMMUNITY): Payer: Self-pay | Admitting: Internal Medicine

## 2012-12-11 ENCOUNTER — Emergency Department (HOSPITAL_COMMUNITY): Payer: Medicare Other

## 2012-12-11 ENCOUNTER — Inpatient Hospital Stay (HOSPITAL_COMMUNITY)
Admission: EM | Admit: 2012-12-11 | Discharge: 2012-12-16 | DRG: 194 | Disposition: A | Discharge status: Skilled Nursing Facility | Payer: Medicare Other | Attending: Internal Medicine | Admitting: Internal Medicine

## 2012-12-11 DIAGNOSIS — R269 Unspecified abnormalities of gait and mobility: Secondary | ICD-10-CM

## 2012-12-11 DIAGNOSIS — I739 Peripheral vascular disease, unspecified: Secondary | ICD-10-CM

## 2012-12-11 DIAGNOSIS — G589 Mononeuropathy, unspecified: Secondary | ICD-10-CM

## 2012-12-11 DIAGNOSIS — S0990XA Unspecified injury of head, initial encounter: Secondary | ICD-10-CM

## 2012-12-11 DIAGNOSIS — F1010 Alcohol abuse, uncomplicated: Secondary | ICD-10-CM

## 2012-12-11 DIAGNOSIS — J449 Chronic obstructive pulmonary disease, unspecified: Secondary | ICD-10-CM

## 2012-12-11 DIAGNOSIS — J4489 Other specified chronic obstructive pulmonary disease: Secondary | ICD-10-CM | POA: Diagnosis present

## 2012-12-11 DIAGNOSIS — J209 Acute bronchitis, unspecified: Secondary | ICD-10-CM

## 2012-12-11 DIAGNOSIS — F411 Generalized anxiety disorder: Secondary | ICD-10-CM

## 2012-12-11 DIAGNOSIS — I872 Venous insufficiency (chronic) (peripheral): Secondary | ICD-10-CM

## 2012-12-11 DIAGNOSIS — F17200 Nicotine dependence, unspecified, uncomplicated: Secondary | ICD-10-CM

## 2012-12-11 DIAGNOSIS — E7800 Pure hypercholesterolemia, unspecified: Secondary | ICD-10-CM

## 2012-12-11 DIAGNOSIS — H3530 Unspecified macular degeneration: Secondary | ICD-10-CM

## 2012-12-11 DIAGNOSIS — J189 Pneumonia, unspecified organism: Secondary | ICD-10-CM | POA: Diagnosis present

## 2012-12-11 DIAGNOSIS — H259 Unspecified age-related cataract: Secondary | ICD-10-CM

## 2012-12-11 HISTORY — DX: Shortness of breath: R06.02

## 2012-12-11 LAB — URINALYSIS, ROUTINE W REFLEX MICROSCOPIC
Bilirubin Urine: NEGATIVE
Glucose, UA: NEGATIVE mg/dL
Ketones, ur: 15 mg/dL — AB
Nitrite: NEGATIVE
Protein, ur: 30 mg/dL — AB
Specific Gravity, Urine: 1.02 (ref 1.005–1.030)
pH: 6 (ref 5.0–8.0)

## 2012-12-11 LAB — CBC WITH DIFFERENTIAL/PLATELET
Basophils Absolute: 0 10*3/uL (ref 0.0–0.1)
Basophils Relative: 0 % (ref 0–1)
Eosinophils Absolute: 0 10*3/uL (ref 0.0–0.7)
HCT: 33.4 % — ABNORMAL LOW (ref 39.0–52.0)
Hemoglobin: 11.1 g/dL — ABNORMAL LOW (ref 13.0–17.0)
Lymphocytes Relative: 4 % — ABNORMAL LOW (ref 12–46)
MCHC: 33.2 g/dL (ref 30.0–36.0)
Monocytes Absolute: 0.2 10*3/uL (ref 0.1–1.0)
Monocytes Relative: 3 % (ref 3–12)
Neutro Abs: 7.3 10*3/uL (ref 1.7–7.7)
Neutrophils Relative %: 93 % — ABNORMAL HIGH (ref 43–77)
Platelets: 139 10*3/uL — ABNORMAL LOW (ref 150–400)
RDW: 13.4 % (ref 11.5–15.5)
WBC: 7.8 10*3/uL (ref 4.0–10.5)

## 2012-12-11 LAB — BASIC METABOLIC PANEL
BUN: 28 mg/dL — ABNORMAL HIGH (ref 6–23)
Chloride: 95 mEq/L — ABNORMAL LOW (ref 96–112)
Creatinine, Ser: 1.08 mg/dL (ref 0.50–1.35)
GFR calc Af Amer: 77 mL/min — ABNORMAL LOW (ref >90)
GFR calc non Af Amer: 66 mL/min — ABNORMAL LOW (ref >90)
Potassium: 3.5 mEq/L (ref 3.5–5.1)

## 2012-12-11 LAB — COMPREHENSIVE METABOLIC PANEL
ALT: 20 U/L (ref 0–53)
Alkaline Phosphatase: 35 U/L — ABNORMAL LOW (ref 39–117)
BUN: 29 mg/dL — ABNORMAL HIGH (ref 6–23)
CO2: 29 mEq/L (ref 19–32)
GFR calc Af Amer: 67 mL/min — ABNORMAL LOW (ref >90)
GFR calc non Af Amer: 58 mL/min — ABNORMAL LOW (ref >90)
Glucose, Bld: 108 mg/dL — ABNORMAL HIGH (ref 70–99)
Potassium: 4 mEq/L (ref 3.5–5.1)
Sodium: 138 mEq/L (ref 135–145)

## 2012-12-11 LAB — TSH: TSH: 0.411 u[IU]/mL (ref 0.350–4.500)

## 2012-12-11 LAB — URINE MICROSCOPIC-ADD ON

## 2012-12-11 MED ORDER — LEVOFLOXACIN IN D5W 750 MG/150ML IV SOLN
750.0000 mg | Freq: Once | INTRAVENOUS | Status: AC
  Administered 2012-12-11: 750 mg via INTRAVENOUS
  Filled 2012-12-11: qty 150

## 2012-12-11 MED ORDER — ASPIRIN 325 MG PO TABS
325.0000 mg | ORAL_TABLET | Freq: Every day | ORAL | Status: DC
  Administered 2012-12-11 – 2012-12-16 (×6): 325 mg via ORAL
  Filled 2012-12-11 (×6): qty 1

## 2012-12-11 MED ORDER — IPRATROPIUM BROMIDE 0.02 % IN SOLN
0.5000 mg | RESPIRATORY_TRACT | Status: DC
  Administered 2012-12-11 (×2): 0.5 mg via RESPIRATORY_TRACT
  Filled 2012-12-11 (×2): qty 2.5

## 2012-12-11 MED ORDER — SODIUM CHLORIDE 0.9 % IV SOLN: INTRAVENOUS | Status: DC

## 2012-12-11 MED ORDER — METHYLPREDNISOLONE SODIUM SUCC 125 MG IJ SOLR
125.0000 mg | Freq: Once | INTRAMUSCULAR | Status: AC
  Administered 2012-12-11: 125 mg via INTRAVENOUS
  Filled 2012-12-11: qty 2

## 2012-12-11 MED ORDER — ALBUTEROL SULFATE (5 MG/ML) 0.5% IN NEBU
2.5000 mg | INHALATION_SOLUTION | Freq: Four times a day (QID) | RESPIRATORY_TRACT | Status: DC
  Administered 2012-12-11 – 2012-12-13 (×6): 2.5 mg via RESPIRATORY_TRACT
  Filled 2012-12-11 (×7): qty 0.5

## 2012-12-11 MED ORDER — VITAMIN B-1 100 MG PO TABS
100.0000 mg | ORAL_TABLET | Freq: Every day | ORAL | Status: DC
  Administered 2012-12-11 – 2012-12-12 (×2): 100 mg via ORAL
  Filled 2012-12-11 (×2): qty 1

## 2012-12-11 MED ORDER — BUDESONIDE 0.25 MG/2ML IN SUSP
0.2500 mg | Freq: Two times a day (BID) | RESPIRATORY_TRACT | Status: DC
  Administered 2012-12-11 – 2012-12-13 (×5): 0.25 mg via RESPIRATORY_TRACT
  Filled 2012-12-11 (×8): qty 2

## 2012-12-11 MED ORDER — IPRATROPIUM BROMIDE 0.02 % IN SOLN
0.5000 mg | Freq: Four times a day (QID) | RESPIRATORY_TRACT | Status: DC
  Administered 2012-12-11 – 2012-12-13 (×6): 0.5 mg via RESPIRATORY_TRACT
  Filled 2012-12-11 (×7): qty 2.5

## 2012-12-11 MED ORDER — ENOXAPARIN SODIUM 40 MG/0.4ML ~~LOC~~ SOLN
40.0000 mg | SUBCUTANEOUS | Status: DC
  Administered 2012-12-11 – 2012-12-16 (×6): 40 mg via SUBCUTANEOUS
  Filled 2012-12-11 (×6): qty 0.4

## 2012-12-11 MED ORDER — ASPIRIN 300 MG RE SUPP
300.0000 mg | Freq: Every day | RECTAL | Status: DC
  Filled 2012-12-11 (×6): qty 1

## 2012-12-11 MED ORDER — LEVOFLOXACIN IN D5W 750 MG/150ML IV SOLN
750.0000 mg | INTRAVENOUS | Status: DC
  Administered 2012-12-12 – 2012-12-13 (×2): 750 mg via INTRAVENOUS
  Filled 2012-12-11 (×2): qty 150

## 2012-12-11 MED ORDER — ALBUTEROL SULFATE (5 MG/ML) 0.5% IN NEBU
5.0000 mg | INHALATION_SOLUTION | Freq: Once | RESPIRATORY_TRACT | Status: AC
  Administered 2012-12-11: 5 mg via RESPIRATORY_TRACT
  Filled 2012-12-11: qty 1

## 2012-12-11 MED ORDER — PANTOPRAZOLE SODIUM 40 MG PO TBEC
40.0000 mg | DELAYED_RELEASE_TABLET | Freq: Every day | ORAL | Status: DC
  Administered 2012-12-11 – 2012-12-15 (×5): 40 mg via ORAL
  Filled 2012-12-11 (×5): qty 1

## 2012-12-11 MED ORDER — ALBUTEROL SULFATE (5 MG/ML) 0.5% IN NEBU
2.5000 mg | INHALATION_SOLUTION | RESPIRATORY_TRACT | Status: DC | PRN
  Administered 2012-12-13: 2.5 mg via RESPIRATORY_TRACT
  Filled 2012-12-11: qty 0.5

## 2012-12-11 MED ORDER — SERTRALINE HCL 100 MG PO TABS
100.0000 mg | ORAL_TABLET | Freq: Every day | ORAL | Status: DC
  Administered 2012-12-11 – 2012-12-16 (×6): 100 mg via ORAL
  Filled 2012-12-11 (×6): qty 1

## 2012-12-11 MED ORDER — SENNOSIDES-DOCUSATE SODIUM 8.6-50 MG PO TABS
1.0000 | ORAL_TABLET | Freq: Every evening | ORAL | Status: DC | PRN
  Administered 2012-12-13: 1 via ORAL
  Filled 2012-12-11 (×2): qty 1

## 2012-12-11 MED ORDER — ALBUTEROL SULFATE (5 MG/ML) 0.5% IN NEBU
2.5000 mg | INHALATION_SOLUTION | RESPIRATORY_TRACT | Status: DC
  Administered 2012-12-11 (×2): 2.5 mg via RESPIRATORY_TRACT
  Filled 2012-12-11 (×2): qty 0.5

## 2012-12-11 MED ORDER — METHYLPREDNISOLONE SODIUM SUCC 40 MG IJ SOLR
40.0000 mg | Freq: Every day | INTRAMUSCULAR | Status: DC
  Administered 2012-12-11 – 2012-12-12 (×2): 40 mg via INTRAVENOUS
  Filled 2012-12-11 (×2): qty 1

## 2012-12-11 NOTE — Progress Notes (Signed)
Pt was seen and examined. H&P and orders reviewed.    Maryln Manuel, MD [phone]
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
## 2012-12-11 NOTE — Progress Notes (Signed)
ANTIBIOTIC CONSULT NOTE - INITIAL  Pharmacy Consult for Levaquin  Indication: pneumonia  Allergies  Allergen Reactions  . Neomycin-Bacitracin Zn-Polymyx Rash   Patient Measurements: Weight: 130 lb 8.2 oz (59.2 kg)  Vital Signs: Temp: 98 F (36.7 C) (11/12 0606) Temp src: Oral (11/12 0606) BP: 129/61 mmHg (11/12 0606) Pulse Rate: 102 (11/12 0606)  Labs:  Recent Labs  12/10/12 2244  WBC 11.4*  HGB 13.1  PLT 191  CREATININE 1.20   Medical History: Past Medical History  Diagnosis Date  . Bilateral senile cataracts   . Macular degeneration, bilateral   . COPD (chronic obstructive pulmonary disease)   . Cigarette smoker   . CAD (coronary artery disease)     ?  Marland Kitchen Peripheral vascular disease   . Alcohol abuse   . Head trauma     closed  . Neuropathy   . Abnormality of gait   . Anxiety    Assessment: 72 y/o with probable PNA per CXR. WBC 11.4, SCr 1.20, afebrile.   Goal of Therapy:  Clinical resolution   Plan:  -Levaquin 750 mg IV q24h -Trend WBC, temp, renal function  -F/U cultures   Thank you for allowing me to take part in this patient's care,  Abran Duke, PharmD Clinical Pharmacist Phone: [phone] Pager: [phone] 12/11/2012 6:43 AM
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
## 2012-12-11 NOTE — Progress Notes (Signed)
OT Cancellation Note  Patient Details Name: Antonio Marshall MRN: [id number] DOB: Jul 24, 1940   Cancelled Treatment:    Reason Eval/Treat Not Completed: Patient not medically ready - Note that OT orders start 12/12/12.  Will initiate eval tomorrow  Boykin Reaper [phone] 12/11/2012, 10:22 AM

## 2012-12-11 NOTE — ED Provider Notes (Signed)
CSN: [id number]     Arrival date & time 12/10/12  2230 History   First MD Initiated Contact with Patient 12/11/12 0249     Chief Complaint  Patient presents with  . Fall   (Consider location/radiation/quality/duration/timing/severity/associated sxs/prior Treatment) HPI  History provided by patient and family bedside. He has remote history of head trauma with residual left upper extremity deficits. He normally ambulates with minimal left lower extremity deficit. Over the last few days has had increased difficulty with his gait. This morning around 5 AM family who was with him states he had some slurred speech. Last seen well was the day before. Apparently had multiple falls throughout the day and tonight when family came home he was unable walk. He denies any fevers or increased dyspnea with history of COPD. He doesn't think that he has had, denies any headache or neck pain. No new weakness or numbness. Slurred speech lasted a few hours and now is at baseline. Past Medical History  Diagnosis Date  . Bilateral senile cataracts   . Macular degeneration, bilateral   . COPD (chronic obstructive pulmonary disease)   . Cigarette smoker   . CAD (coronary artery disease)     ?  Marland Kitchen Peripheral vascular disease   . Alcohol abuse   . Head trauma     closed  . Neuropathy   . Abnormality of gait   . Anxiety    Past Surgical History  Procedure Laterality Date  . Appendectomy    . 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 Asthma Brother   . Asthma Brother   . Asthma Brother   . Emphysema Brother   . Diabetes Mother   . Arthritis Mother   . Heart disease Mother   . Diabetes Brother   . Breast cancer Sister    History  Substance Use Topics  . Smoking status: Former Smoker -- 0.50 packs/day for 52 years    Types: Cigarettes    Quit date: 06/24/2012  . Smokeless tobacco: Never Used     Comment: 1 pack every 2 days  . Alcohol Use: 2.0 oz/week     4 drink(s) per week    Review of Systems  Constitutional: Negative for fever and chills.  Respiratory: Negative for chest tightness and stridor.   Cardiovascular: Negative for chest pain.  Gastrointestinal: Negative for vomiting and abdominal pain.  Genitourinary: Negative for dysuria.  Musculoskeletal: Negative for back pain, neck pain and neck stiffness.  Skin: Negative for rash.  Neurological: Positive for speech difficulty. Negative for syncope and headaches.  All other systems reviewed and are negative.    Allergies  Neomycin-bacitracin zn-polymyx  Home Medications   Current Outpatient Rx  Name  Route  Sig  Dispense  Refill  . albuterol (ACCUNEB) 0.63 MG/3ML nebulizer solution   Nebulization   Take 1 ampule by nebulization every 6 (six) hours as needed for wheezing.         . fluticasone-salmeterol (ADVAIR HFA) 115-21 MCG/ACT inhaler   Inhalation   Inhale 2 puffs into the lungs 2 (two) times daily.         Marland Kitchen ipratropium (ATROVENT) 0.02 % nebulizer solution   Nebulization   Take 0.5 mg by nebulization 4 (four) times daily.         . methylPREDNISolone (MEDROL) 4 MG tablet   Oral   Take 1 tablet (4 mg total) by mouth daily.  30 tablet   6   . Multiple Vitamin (MULTIVITAMIN WITH MINERALS) TABS tablet   Oral   Take 1 tablet by mouth daily.         . sertraline (ZOLOFT) 100 MG tablet   Oral   Take 1 tablet (100 mg total) by mouth daily.   30 tablet   11    BP 126/78  Pulse 82  Temp(Src) 98.1 F (36.7 C) (Oral)  Resp 18  SpO2 95% Physical Exam  Constitutional: He is oriented to person, place, and time. He appears well-developed and well-nourished.  HENT:  Head: Normocephalic and atraumatic.  Eyes: EOM are normal. Pupils are equal, round, and reactive to light.  Neck: Neck supple.  No C-spine tenderness or deformity  Cardiovascular: Normal rate, regular rhythm and intact distal pulses.   Pulmonary/Chest: Effort normal. No respiratory distress.   Prolonged expirations and intermittent exp wheezes  Musculoskeletal:  Left upper extremity contracture, moves bilateral lower extremities with equal strengths and equal sensorium to light touch. Right upper extremity range of motion without deficits.  Neurological: He is alert and oriented to person, place, and time.  Skin: Skin is warm and dry.    ED Course  Procedures (including critical care time) Labs Review Labs Reviewed  CBC WITH DIFFERENTIAL - Abnormal; Notable for the following:    WBC 11.4 (*)    HCT 38.0 (*)    Neutrophils Relative % 83 (*)    Neutro Abs 9.4 (*)    Lymphocytes Relative 8 (*)    All other components within normal limits  COMPREHENSIVE METABOLIC PANEL - Abnormal; Notable for the following:    Chloride 95 (*)    Glucose, Bld 108 (*)    BUN 29 (*)    AST 59 (*)    Alkaline Phosphatase 35 (*)    GFR calc non Af Amer 58 (*)    GFR calc Af Amer 67 (*)    All other components within normal limits  AMMONIA  URINALYSIS, ROUTINE W REFLEX MICROSCOPIC   Imaging Review Dg Chest 2 View  12/11/2012   CLINICAL DATA:  Multiple falls.  EXAM: CHEST  2 VIEW  COMPARISON:  Chest radiograph Jun 25, 2012.  FINDINGS: Cardiac silhouette is upper limits of normal, moderately calcified aorta. Similar chronic interstitial changes, with minimal probable left posterior costophrenic angle airspace opacity with trace pleural effusions. No pneumothorax. Biapical pleural parenchymal scarring.  Again noted is left posterior 3rd and 4th rib fractures, more chronic appearing 7th and 8th rib fractures on the left. Remote distal left clavicle fracture, remote proximal left humerus fracture. Mild age indeterminate approximately T9 compression fracture. Soft tissue planes are nonsuspicious.  IMPRESSION: Chronic interstitial changes/emphysema with mild left lower lobe airspace opacity and small left pleural effusion concerning for pneumonia. Recommend followup chest radiograph after treatment to  verify improvement.   Electronically Signed   By: Awilda Metro   On: 12/11/2012 03:36   Ct Head Wo Contrast  12/11/2012   CLINICAL DATA:  Partially paralyzed, recent fall. No definite head trauma.  EXAM: CT HEAD WITHOUT CONTRAST  CT CERVICAL SPINE WITHOUT CONTRAST  TECHNIQUE: Multidetector CT imaging of the head and cervical spine was performed following the standard protocol without intravenous contrast. Multiplanar CT image reconstructions of the cervical spine were also generated.  COMPARISON:  CT of the head May 16, 2011.  FINDINGS: CT HEAD FINDINGS  No intraparenchymal hemorrhage, mass effect or midline shift. No acute large vascular territory infarct.  Status post  bilateral craniectomies with dural flaps in place, which result in some streak artifact. Moderate ventriculomegaly, likely on the basis of parenchymal brain volume loss, with right parietal encephalomalacia and porencephaly. Bilateral inferior frontal lobe encephalomalacia, left posterior frontal lobe encephalomalacia is similar, the right temporal lobe encephalomalacia seen previously is less apparent due to a aforementioned artifact. 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  No abnormal extra-axial fluid collections. Basal cisterns are patent. Dense calcific atherosclerosis of the carotid siphons.  Status post left ocular lens implant, the ocular globes and orbital contents are nonsuspicious. Trace paranasal sinus mucosal thickening, without air-fluid levels. Small left frontal mucosal retention cyst. Mastoid air cells are well aerated. Patient is edentulous.  CT CERVICAL SPINE FINDINGS  Mild motion degraded examination.  Cervical vertebral bodies and posterior elements appear intact and aligned with straightened cervical lordosis. Severe C5-6 and C6-7 degenerative disc disease with ventral spurring, moderate at C3-4 and moderate to severe at C7-T1.  Bone mineral density is  decreased without destructive bony lesions. C1-2 articulation maintained. Mild pannus about the odontoid process can be seen with CPPD. Moderate calcific atherosclerosis of the left greater than right carotid bulbs.  Included view of the lungs demonstrates biapical pleural parenchymal scarring, moderate to severe centrilobular and paraseptal emphysema.  Degenerative disc disease and facet arthropathy result in moderate C5-6 and C6-7 canal stenosis, severe neural foraminal narrowing at these 2 levels.  IMPRESSION: CT Head: No acute intracranial process.  Status post bilateral cranioplasty's, with remote posttraumatic change.  CT cervical spine: Straightened cervical lordosis without fracture or malalignment.  Degenerative change of the cervical spine results in moderate C5-6 and C6-7 canal stenosis, severe neural foraminal narrowing at these 2 levels.  moderate C5-6 and C6-7 canal stenosis, severe neural foraminal narrowing at these 2 levels.  moderate C5-6 and C6-7 canal stenosis, severe neural foraminal narrowing at these 2 levels.   Electronically Signed   By: Awilda Metro   On: 12/11/2012 03:56   Ct Cervical Spine Wo Contrast  12/11/2012   CLINICAL DATA:  Partially paralyzed, recent fall. No definite head trauma.  EXAM: CT HEAD WITHOUT CONTRAST  CT CERVICAL SPINE WITHOUT CONTRAST  TECHNIQUE: Multidetector CT imaging of the head and cervical spine was performed following the standard protocol without intravenous contrast. Multiplanar CT image reconstructions of the cervical spine were also generated.  COMPARISON:  CT of the head May 16, 2011.  FINDINGS: CT HEAD FINDINGS  No intraparenchymal hemorrhage, mass effect or midline shift. No acute large vascular territory infarct.  Status post bilateral craniectomies with dural flaps in place, which result in some streak artifact. Moderate ventriculomegaly, likely on the basis of parenchymal brain volume loss, with right parietal encephalomalacia and  porencephaly. Bilateral inferior frontal lobe encephalomalacia, left posterior frontal lobe encephalomalacia is similar, the right temporal lobe encephalomalacia seen previously is less apparent due to a aforementioned artifact. 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  No abnormal extra-axial fluid collections. Basal cisterns are patent. Dense calcific atherosclerosis of the carotid siphons.  Status post left ocular lens implant, the ocular globes and orbital contents are nonsuspicious. Trace paranasal sinus mucosal thickening, without air-fluid levels. Small left frontal mucosal retention cyst. Mastoid air cells are well aerated. Patient is edentulous.  CT CERVICAL SPINE FINDINGS  Mild motion degraded examination.  Cervical vertebral bodies and posterior elements appear intact and aligned with straightened cervical lordosis. Severe C5-6 and C6-7 degenerative disc disease  with ventral spurring, moderate at C3-4 and moderate to severe at C7-T1.  Bone mineral density is decreased without destructive bony lesions. C1-2 articulation maintained. Mild pannus about the odontoid process can be seen with CPPD. Moderate calcific atherosclerosis of the left greater than right carotid bulbs.  Included view of the lungs demonstrates biapical pleural parenchymal scarring, moderate to severe centrilobular and paraseptal emphysema.  Degenerative disc disease and facet arthropathy result in moderate C5-6 and C6-7 canal stenosis, severe neural foraminal narrowing at these 2 levels.  IMPRESSION: CT Head: No acute intracranial process.  Status post bilateral cranioplasty's, with remote posttraumatic change.  CT cervical spine: Straightened cervical lordosis without fracture or malalignment.  Degenerative change of the cervical spine results in moderate C5-6 and C6-7 canal stenosis, severe neural foraminal narrowing at these 2 levels.  moderate C5-6 and C6-7 canal stenosis,  severe neural foraminal narrowing at these 2 levels.  moderate C5-6 and C6-7 canal stenosis, severe neural foraminal narrowing at these 2 levels.   Electronically Signed   By: Awilda Metro   On: 12/11/2012 03:56   IV steroids, IV ABX, albuterol TX  4:59 AM d/w Dr Toniann Fail - will admit  MDM  DX: Gait difficulty, COPD, pneumonia  Labs, imaging reviewed as above MED consult/ admit    Sunnie Nielsen, MD 12/12/12 2317

## 2012-12-11 NOTE — Evaluation (Signed)
Clinical/Bedside Swallow Evaluation Patient Details  Name: Antonio Marshall MRN: [id number] Date of Birth: 09-11-1940  Today's Date: 12/11/2012 Time: 4098-1191 SLP Time Calculation (min): 17 min  Past Medical History:  Past Medical History  Diagnosis Date  . Bilateral senile cataracts   . Macular degeneration, bilateral   . COPD (chronic obstructive pulmonary disease)   . Cigarette smoker   . CAD (coronary artery disease)     ?  Marland Kitchen Peripheral vascular disease   . Alcohol abuse   . Head trauma     closed  . Neuropathy   . Abnormality of gait   . Anxiety    Past Surgical History:  Past Surgical History  Procedure Laterality Date  . Appendectomy    . 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 male with known history of COPD and ongoing tobacco abuse, history of head trauma, hyperlipidemia on diet and previous history of alcohol abuse quit one year ago was brought to the ER after patient has been having frequent falls over the last 2 days. As per the patient's daughter with whom I spoke patient has been having frequent falls and yesterday in the morning on 5 AM patient also had brief episode of slurred speech. In addition patient has been having some productive cough for last 3 days. In the ER patient was found to have mild wheeze and was treated with nebulizer and Solu-Medrol and started on antibiotics for pneumonia. CXR revealed chronic interstitial changes/emphysema with mild left lower lobe airspace opacity and small left pleural effusion concerning for pneumonia. CT head did not show anything acute and C-spine showed canal stenosis and narrowing. Patient has been on it for further workup.    Assessment / Plan / Recommendation Clinical Impression  Pt. demonstrated functional oropharyngeal swallow without s/s aspiration.  Pt. reports history of globus sensation.  SLP suspects possible esophageal deficits due to history of ETOH abuse.  Pt. may have aspirated esophageal residue  if present resulting in his pna.  This therapist provided education on esophageal precautions with clinical reasoning and importance.  Recommend regular texture diet and thin liquids, pills with liquid.  No follow up needed.  Initial order for Speech eval and treat not indicated by physician (clarified order was for swallow).    Aspiration Risk  Mild    Diet Recommendation Regular;Thin liquid   Liquid Administration via: Cup;Straw Medication Administration: Whole meds with liquid Supervision: Patient able to self feed Compensations: Slow rate;Small sips/bites Postural Changes and/or Swallow Maneuvers: Seated upright 90 degrees;Upright 30-60 min after meal    Other  Recommendations Oral Care Recommendations: Oral care BID   Follow Up Recommendations  None    Frequency and Duration        Pertinent Vitals/Pain n/a         Swallow Study          Oral/Motor/Sensory Function Overall Oral Motor/Sensory Function: Appears within functional limits for tasks assessed   Ice Chips Ice chips: Not tested   Thin Liquid Thin Liquid: Within functional limits Presentation: Cup;Straw    Nectar Thick Nectar Thick Liquid: Not tested   Honey Thick Honey Thick Liquid: Not tested   Puree Puree: Within functional limits   Solid   GO    Solid: Within functional limits       [name] M.Ed [employer] [phone]  12/11/2012

## 2012-12-12 ENCOUNTER — Telehealth: Payer: Self-pay | Admitting: Pulmonary Disease

## 2012-12-12 ENCOUNTER — Ambulatory Visit: Payer: Medicare Other | Admitting: Adult Health

## 2012-12-12 DIAGNOSIS — J209 Acute bronchitis, unspecified: Secondary | ICD-10-CM

## 2012-12-12 LAB — COMPREHENSIVE METABOLIC PANEL
ALT: 24 U/L (ref 0–53)
Albumin: 2.9 g/dL — ABNORMAL LOW (ref 3.5–5.2)
Alkaline Phosphatase: 28 U/L — ABNORMAL LOW (ref 39–117)
BUN: 26 mg/dL — ABNORMAL HIGH (ref 6–23)
CO2: 25 mEq/L (ref 19–32)
Chloride: 97 mEq/L (ref 96–112)
Creatinine, Ser: 1.15 mg/dL (ref 0.50–1.35)
GFR calc Af Amer: 71 mL/min — ABNORMAL LOW (ref >90)
GFR calc non Af Amer: 61 mL/min — ABNORMAL LOW (ref >90)
Glucose, Bld: 113 mg/dL — ABNORMAL HIGH (ref 70–99)
Potassium: 3.5 mEq/L (ref 3.5–5.1)
Sodium: 136 mEq/L (ref 135–145)
Total Bilirubin: 0.4 mg/dL (ref 0.3–1.2)

## 2012-12-12 LAB — CBC
HCT: 29.5 % — ABNORMAL LOW (ref 39.0–52.0)
Hemoglobin: 10.2 g/dL — ABNORMAL LOW (ref 13.0–17.0)
RBC: 3.47 MIL/uL — ABNORMAL LOW (ref 4.22–5.81)
RDW: 13.5 % (ref 11.5–15.5)
WBC: 10.2 10*3/uL (ref 4.0–10.5)

## 2012-12-12 LAB — INFLUENZA PANEL BY PCR (TYPE A & B)
H1N1 flu by pcr: NOT DETECTED
Influenza A By PCR: NEGATIVE
Influenza B By PCR: NEGATIVE

## 2012-12-12 LAB — LIPID PANEL: LDL Cholesterol: 96 mg/dL (ref 0–99)

## 2012-12-12 LAB — HEMOGLOBIN A1C: Hgb A1c MFr Bld: 5.9 % — ABNORMAL HIGH (ref <5.7)

## 2012-12-12 MED ORDER — PREDNISONE 20 MG PO TABS
40.0000 mg | ORAL_TABLET | Freq: Every day | ORAL | Status: DC
  Filled 2012-12-12 (×2): qty 2

## 2012-12-12 MED ORDER — PREDNISONE 20 MG PO TABS
40.0000 mg | ORAL_TABLET | Freq: Every day | ORAL | Status: DC
  Administered 2012-12-13: 40 mg via ORAL
  Filled 2012-12-12 (×2): qty 2

## 2012-12-12 NOTE — Telephone Encounter (Signed)
Spoke with pt daughter, [name]-- appt cancelled appt at 2p with TP d/t pt being in hospital right now for PNA. Patient taken to hospital d/t multiple times (15x within 12hr period) Patient seems confused and having memory issues- slurred speech.  Doctor at ED requesting order from our office for MRI-- d/t recent increased falls ED Dr is wanting to have this done. Daughter is requesting that a request for light sedation during MRI be included as well.  Please advise Tammy. Thanks.

## 2012-12-12 NOTE — Care Management Note (Signed)
    Page 1 of 2   12/16/2012     4:50:17 PM   CARE MANAGEMENT NOTE 12/16/2012  Patient:  Antonio Marshall, Antonio Marshall   Account Number:  [id number]  Date Initiated:  12/12/2012  Documentation initiated by:  Darean Rote  Subjective/Objective Assessment:   PT ADM ON 12/10/12 WITH MULT FALLS, PNA.  PTA, PT LIVES WITH DAUGHTER DONNA.     Action/Plan:   SPOKE WITH DAUGHTER, DONNA, BY PHONE TO DISCUSS DC PLANS. PT/OT RECOMMENDING SNF AT DC.   Anticipated DC Date:  12/16/2012   Anticipated DC Plan:  SKILLED NURSING FACILITY  In-house referral  Clinical Social Worker      DC Planning Services  CM consult      Choice offered to / List presented to:             Status of service:  Completed, signed off Medicare Important Message given?   (If response is "NO", the following Medicare IM given date fields will be blank) Date Medicare IM given:   Date Additional Medicare IM given:    Discharge Disposition:  SKILLED NURSING FACILITY  Per UR Regulation:  Reviewed for med. necessity/level of care/duration of stay  If discussed at Long Length of Stay Meetings, dates discussed:    Comments:   12/16/12 Deneane Stifter,RN,BSN [phone] PT DISCHARGING TO SNF TODAY, PER CSW ARRANGEMENTS.  12/13/12 Ashir Kunz,RN,BSN [phone] SPOKE WITH DAUGHTER, DONNA TO CHECK ON DECISION FOR SNF. SHE STATES PT WISHES TO TRY SNF FOR REHAB AT DC.  WILL CONSULT CSW TO FACILITATE THIS WHEN MEDICALLY STABLE FOR DC.  CSW TO HAVE W/E SW FOLLOW UP WITH PT/FAMILY.  DTR ALSO REQUESTS LIVING WILL; LEFT [employer]VANCED DIRECTIVE PACKET IN THE ROOM FOR DTR--INSTRUCTED HER TO FILL OUT, AND HAVE RN CONTACT SPIRITUAL CARE FOR NOTARY WHEN COMPLETE.  12/12/12 Shayonna Ocampo,RN,BSN [phone] DAUGHTER STATES SHE CAN PROVIDE 24H CARE AT DC, BUT PLANS TO SPEAK WITH HER FATHER ABOUT POSSIBLE SNF FOR REHAB AT DC.  SHE STATES SHE IS NOT SUPPOSED TO LIFT OVER 30 LBS, BUT HER HUSBAND IS THERE TO HELP 24/7 AS WELL.  SHE PLANS TO SPEAK WITH PT ABOUT SNF  THIS EVE AND LET STAFF KNOW BY TOMORROW ABOUT DECISION FOR SNF.  IF PT SAYS NO, DTR AGREEABLE TO FOLLOW UP AT HOME WITH HH CARE.  WILL FOLLOW UP.

## 2012-12-12 NOTE — Evaluation (Signed)
Occupational Therapy Evaluation Patient Details Name: Antonio Marshall MRN: [id number] DOB: 11-May-1940 Today's Date: 12/12/2012 Time: 7829-5621 OT Time Calculation (min): 26 min  OT Assessment / Plan / Recommendation History of present illness Antonio Marshall is a 72 y.o. male with known history of COPD and ongoing tobacco abuse, history of head trauma, hyperlipidemia on diet and previous history of alcohol abuse quit one year ago was brought to the ER after patient has been having frequent falls over the last 2 days. As per the patient's daughter with whom I spoke patient has been having frequent falls and yesterday in the morning on 5 AM patient also had brief episode of slurred speech. He did not lose consciousness. Patient has mild weakness of the left lower extremity from previous trauma. In addition patient has been having some productive cough for last 3 days. In the ER patient was found to have mild wheeze and was treated with nebulizer and Solu-Medrol and started on antibiotics for pneumonia. CT head did not show anything acute and C-spine showed canal stenosis and narrowing.    Clinical Impression   Pt admitted with above.  He presents to OT with significant balance deficits.  Pt is a poor historian so establishing a baseline PLOF is very difficult and no family present.  Currently, pt requires min - mod A for BADLs.  Feel he will benefit from continued OT to address the below listed deficits to increase his independence and safety with BADLs.  Anticipate he will need SNF level rehab at discharge.     OT Assessment  Patient needs continued OT Services    Follow Up Recommendations  SNF;Supervision/Assistance - 24 hour    Barriers to Discharge   uncertain re: level of support at discharge  Equipment Recommendations  3 in 1 bedside comode    Recommendations for Other Services    Frequency  Min 2X/week    Precautions / Restrictions Precautions Precautions: Fall   Pertinent  Vitals/Pain     ADL  Eating/Feeding: Modified independent Where Assessed - Eating/Feeding: Chair Grooming: Wash/dry hands;Wash/dry face;Brushing hair;Min guard Where Assessed - Grooming: Unsupported sitting Upper Body Bathing: Minimal assistance Where Assessed - Upper Body Bathing: Unsupported sitting Lower Body Bathing: Moderate assistance Where Assessed - Lower Body Bathing: Supported sit to stand Upper Body Dressing: Minimal assistance Where Assessed - Upper Body Dressing: Unsupported sitting Lower Body Dressing: Moderate assistance Where Assessed - Lower Body Dressing: Supported sit to Pharmacist, hospital: Moderate assistance Toilet Transfer Method: Sit to stand;Stand pivot Toilet Transfer Equipment: Bedside commode Toileting - Clothing Manipulation and Hygiene: Moderate assistance Where Assessed - Toileting Clothing Manipulation and Hygiene: Standing Transfers/Ambulation Related to ADLs: Mod A to transfer to chair due to impulsivity, impaired balance, and pt did a 360* turn.  Pt able to take 4 steps forward with min A and mod A to step back towards chair - loses balance ADL Comments: Pt requires assist due to impaired balance    OT Diagnosis: Generalized weakness;Cognitive deficits;Disturbance of vision  OT Problem List: Decreased strength;Decreased activity tolerance;Impaired balance (sitting and/or standing);Decreased coordination;Impaired vision/perception;Decreased cognition;Decreased safety awareness;Decreased knowledge of use of DME or AE;Impaired UE functional use OT Treatment Interventions: Self-care/ADL training;Neuromuscular education;DME and/or AE instruction;Therapeutic activities;Patient/family education;Balance training   OT Goals(Current goals can be found in the care plan section) Acute Rehab OT Goals Patient Stated Goal: To stop fallin OT Goal Formulation: With patient Time For Goal Achievement: 12/26/12 Potential to Achieve Goals: Good ADL Goals Pt Will  Perform Grooming:  standing;with min guard assist Pt Will Perform Upper Body Bathing: with supervision;sitting Pt Will Perform Lower Body Bathing: with min guard assist;sit to/from stand Pt Will Perform Upper Body Dressing: with supervision;sitting Pt Will Perform Lower Body Dressing: with min guard assist;sit to/from stand Pt Will Transfer to Toilet: with min assist;ambulating;regular height toilet;grab bars Pt Will Perform Toileting - Clothing Manipulation and hygiene: with min guard assist;sit to/from stand  Visit Information  Last OT Received On: 12/12/12 Assistance Needed: +1 History of Present Illness: Antonio Marshall is a 72 y.o. male with known history of COPD and ongoing tobacco abuse, history of head trauma, hyperlipidemia on diet and previous history of alcohol abuse quit one year ago was brought to the ER after patient has been having frequent falls over the last 2 days. As per the patient's daughter with whom I spoke patient has been having frequent falls and yesterday in the morning on 5 AM patient also had brief episode of slurred speech. He did not lose consciousness. Patient has mild weakness of the left lower extremity from previous trauma. In addition patient has been having some productive cough for last 3 days. In the ER patient was found to have mild wheeze and was treated with nebulizer and Solu-Medrol and started on antibiotics for pneumonia. CT head did not show anything acute and C-spine showed canal stenosis and narrowing.        Prior Functioning     Home Living Family/patient expects to be discharged to:: Private residence Available Help at Discharge: Family (Pt unable to accurately provide info) Type of Home: House Home Access: Stairs to enter [employer] of Steps: Pt unable to state Home Layout: Two level;Other (Comment) (pt inconsistent with responses) Additional Comments: When asked if he has a walker at home, he points to the [facility] and states he has  "one like that".  When asked if he has a BSC he says no.  He repeats I have one like that with a seat.  Asked if he has a walker with a seat, he says no. Prior Function Comments: Pt unable to provide info correctly.  Contradicts himself.  He does report he had 12-15 falls the day he was admitted.  He indicates that dtr does not work, but is unable to state if she can provide physical assistance.  He also states he has a building at her dtr's, but he stays in the house most of the time.  No family present to determine baseline and home environment Communication Communication: No difficulties Dominant Hand: Right         Vision/Perception Vision - History Visual History: Brain injury Vision - Assessment Vision Assessment: Vision not tested Additional Comments: Pt indicates visual deficits from previous BI.  Might wear glasses - pt poor historian.  He is unable to read clock.  Uncertain of his baseline   Cognition  Cognition Arousal/Alertness: Awake/alert Behavior During Therapy: WFL for tasks assessed/performed Overall Cognitive Status: No family/caregiver present to determine baseline cognitive functioning (Pt with h/o BI)    Extremity/Trunk Assessment Upper Extremity Assessment Upper Extremity Assessment: LUE deficits/detail LUE Deficits / Details: Pt with flexor spasticity Lt. UE, but is able to actively move out of it.  Uses Lt. UE as a gross assist.  Pt reports Lt. UE has been like that since 1982 with no change LUE Coordination: decreased fine motor;decreased gross motor Lower Extremity Assessment Lower Extremity Assessment: Defer to PT evaluation     Mobility Bed Mobility Bed Mobility: Supine to  Sit;Sitting - Scoot to Edge of Bed Supine to Sit: 4: Min assist;HOB flat;With rails Sitting - Scoot to Edge of Bed: 4: Min assist Details for Bed Mobility Assistance: Pt uses momentum to move to sitting position and required assist to lift shoulders from bed Transfers Transfers: Sit  to Stand;Stand to Sit Sit to Stand: 4: Min assist;With upper extremity assist;From bed Stand to Sit: 4: Min assist;With upper extremity assist;To chair/3-in-1 Details for Transfer Assistance: min A for sit to stand; mod A for stand pivot transfer     Exercise     Balance Balance Balance Assessed: Yes Dynamic Sitting Balance Dynamic Sitting - Balance Support: Feet supported Dynamic Sitting - Level of Assistance: 4: Min assist Dynamic Sitting Balance - Compensations: loses balance posteriorly Dynamic Sitting - Balance Activities: Other (comment) (donning/doffing socks EOB)   End of Session OT - End of Session Activity Tolerance: Patient tolerated treatment well Patient left: in chair;with call bell/phone within reach;with chair alarm set Nurse Communication: Mobility status  GO     Antonio Marshall 12/12/2012, 12:53 PM

## 2012-12-12 NOTE — Telephone Encounter (Signed)
Pt was admitted to [facility] 11.12.14 MRI brain was already done Called spoke with [name] and informed her of this.  [name] stated that after she had called the office, she spoke with anther family member who had informed her of this as well. Nothing further needed; will sign off.

## 2012-12-12 NOTE — Progress Notes (Signed)
Physical Therapy Evaluation Patient Details Name: Antonio Marshall MRN: [id number] DOB: 07-Jul-1940 Today's Date: 12/12/2012 Time: 4098-1191 PT Time Calculation (min): 20 min  PT Assessment / Plan / Recommendation History of Present Illness  Antonio Marshall is a 72 y.o. male with known history of COPD and ongoing tobacco abuse, history of head trauma, hyperlipidemia on diet and previous history of alcohol abuse quit one year ago was brought to the ER after patient has been having frequent falls over the last 2 days. As per the patient's daughter with whom I spoke patient has been having frequent falls and yesterday in the morning on 5 AM patient also had brief episode of slurred speech. He did not lose consciousness. Patient has mild weakness of the left lower extremity from previous trauma. In addition patient has been having some productive cough for last 3 days. In the ER patient was found to have mild wheeze and was treated with nebulizer and Solu-Medrol and started on antibiotics for pneumonia. CT head did not show anything acute and C-spine showed canal stenosis and narrowing.   Clinical Impression  Pt admitted with above. Pt currently with functional limitations due to the deficits listed below (see PT Problem List). Will need NHP if daughter cannot provide 24 hour assist.  Pt at one moment states he is at baseline and next moment states he walked like he is presently at home.  Pt very unsteady.   Pt will benefit from skilled PT to increase their independence and safety with mobility to allow discharge to the venue listed below.     PT Assessment  Patient needs continued PT services    Follow Up Recommendations  SNF;Supervision/Assistance - 24 hour        Barriers to Discharge Other (comment) (unsure of caregiver support)      Equipment Recommendations  Other (comment) (TBA)         Frequency Min 3X/week    Precautions / Restrictions Precautions Precautions:  Fall Restrictions Weight Bearing Restrictions: No   Pertinent Vitals/Pain VSS, No pain      Mobility  Bed Mobility Bed Mobility: Supine to Sit;Sitting - Scoot to Edge of Bed Supine to Sit: 4: Min assist;HOB flat;With rails Sitting - Scoot to Edge of Bed: 4: Min assist Details for Bed Mobility Assistance: Pt uses momentum to move to sitting position and required assist to lift shoulders from bed Transfers Transfers: Sit to Stand;Stand to Sit Sit to Stand: 4: Min assist;With upper extremity assist;From bed Stand to Sit: 4: Min assist;With upper extremity assist;To chair/3-in-1 Details for Transfer Assistance: min A for sit to stand; mod A for stand pivot transfer.  Pt not fully attentive to left side with mobility Ambulation/Gait Ambulation/Gait Assistance: 3: Mod assist Ambulation Distance (Feet): 85 Feet Assistive device: Rolling walker Ambulation/Gait Assistance Details: Pt ambulated with RW with cues for sequencing steps and RW.  Pt ambulating to left side of RW.  Very unsteady needing constant assist to steady pt.  Asked pt if he ambulated like this at home and he states he is weaker on the left side.   Gait Pattern: Step-to pattern;Decreased stride length;Decreased step length - right;Decreased step length - left;Trunk flexed;Narrow base of support;Scissoring;Ataxic Gait velocity: decreased Stairs: No Wheelchair Mobility Wheelchair Mobility: No         PT Diagnosis: Generalized weakness  PT Problem List: Decreased strength;Decreased activity tolerance;Decreased balance;Decreased mobility;Decreased knowledge of use of DME;Decreased safety awareness;Decreased knowledge of precautions PT Treatment Interventions: DME instruction;Gait training;Functional mobility training;Therapeutic  activities;Therapeutic exercise;Balance training;Patient/family education     PT Goals(Current goals can be found in the care plan section) Acute Rehab PT Goals Patient Stated Goal: To stop  fallin PT Goal Formulation: With patient Time For Goal Achievement: 12/19/12 Potential to Achieve Goals: Good  Visit Information  Last PT Received On: 12/05/12 Assistance Needed: +2 History of Present Illness: Antonio Marshall is a 72 y.o. male with known history of COPD and ongoing tobacco abuse, history of head trauma, hyperlipidemia on diet and previous history of alcohol abuse quit one year ago was brought to the ER after patient has been having frequent falls over the last 2 days. As per the patient's daughter with whom I spoke patient has been having frequent falls and yesterday in the morning on 5 AM patient also had brief episode of slurred speech. He did not lose consciousness. Patient has mild weakness of the left lower extremity from previous trauma. In addition patient has been having some productive cough for last 3 days. In the ER patient was found to have mild wheeze and was treated with nebulizer and Solu-Medrol and started on antibiotics for pneumonia. CT head did not show anything acute and C-spine showed canal stenosis and narrowing.        Prior Functioning  Home Living Family/patient expects to be discharged to:: Private residence Living Arrangements: Children Available Help at Discharge: Family (Pt unable to accurately provide info) Type of Home: House Home Access: Stairs to enter [employer] of Steps: Pt unable to state Home Layout: Two level;Other (Comment) (pt inconsistent with responses) Additional Comments: Pt unsure of home environment and equipment. Prior Function Comments: Pt unable to provide info correctly.  Contradicts himself.  He does report he had 12-15 falls the day he was admitted.  He indicates that dtr does not work, but is unable to state if she can provide physical assistance.  He also states he has a building at her dtr's, but he stays in the house most of the time.  No family present to determine baseline and home  environment Communication Communication: No difficulties Dominant Hand: Right    Cognition  Cognition Arousal/Alertness: Awake/alert Behavior During Therapy: WFL for tasks assessed/performed Overall Cognitive Status: No family/caregiver present to determine baseline cognitive functioning (Pt with h/o BI)    Extremity/Trunk Assessment Upper Extremity Assessment Upper Extremity Assessment: Defer to OT evaluation LUE Deficits / Details: Pt with flexor spasticity Lt. UE, but is able to actively move out of it.  Uses Lt. UE as a gross assist.  Pt reports Lt. UE has been like that since 1982 with no change LUE Coordination: decreased fine motor;decreased gross motor Lower Extremity Assessment Lower Extremity Assessment: RLE deficits/detail;LLE deficits/detail RLE Deficits / Details: grossly 3/5 LLE Deficits / Details: grossly 3-/5 LLE Coordination: decreased fine motor;decreased gross motor Cervical / Trunk Assessment Cervical / Trunk Assessment: Normal   Balance Balance Balance Assessed: Yes Dynamic Sitting Balance Dynamic Sitting - Balance Support: Feet supported Dynamic Sitting - Level of Assistance: 4: Min assist Dynamic Sitting Balance - Compensations: loses balance posteriorly Dynamic Sitting - Balance Activities: Other (comment) (donning/doffing socks EOB) High Level Balance High Level Balance Activites: Turns;Sudden stops;Direction changes High Level Balance Comments: Needed mod assist for stability with RW.   End of Session PT - End of Session Equipment Utilized During Treatment: Gait belt Activity Tolerance: Patient limited by fatigue Patient left: in chair;with call bell/phone within reach;with chair alarm set Nurse Communication: Mobility status  INGOLD,Bennie Scaff 12/12/2012, 3:37 PM [facility] Acute Rehabilitation [phone] (pager)

## 2012-12-12 NOTE — Progress Notes (Signed)
[facility] PROGRESS NOTE  Antonio Marshall WNU:[id number] DOB: [birth year] DOA: 12/11/2012 PCP: Michele Mcalpine, MD  Assessment/Plan: 1. Gait instability - MRI brain negative for acute CVA.  MRI c/spine pending. Physical therapy consult recommending rehab as pt is at high risk for falling. 2. Pneumonia - patient has been placed on Levaquin. Influenza PCR negative. 3. COPD with mild exacerbation - continue with nebulizer and steroids. 4. Previous history of alcoholism - patient has quit drinking more than a year ago. Patient has been placed on thiamine. 5. History of brain trauma. 6. History of hyperlipidemia on diet.  Code Status: Full  Family Communication: daughter  HPI/Subjective: Pt sitting up in chair, no complaints, tolerating regular diet.   Objective: Filed Vitals:   12/12/12 0352  BP: 158/70  Pulse: 77  Temp: 98.4 F (36.9 C)  Resp: 18    Intake/Output Summary (Last 24 hours) at 12/12/12 1233 Last data filed at 12/12/12 0700  Gross per 24 hour  Intake    240 ml  Output    225 ml  Net     15 ml   Filed Weights   12/11/12 0605 12/11/12 0833 12/12/12 0352  Weight: 130 lb 8.2 oz (59.2 kg) 130 lb 1.1 oz (59 kg) 133 lb 6.1 oz (60.5 kg)    Exam:  General: Well-developed and nourished.  Eyes: Anicteric no pallor.  ENT: No discharge from the ears eyes nose or mouth. Poor dentition. Neck: No mass felt.  Cardiovascular: normal S1-S2 heard.  Respiratory: Mild expiratory wheeze no crepitations.  Abdomen: Soft nontender bowel sounds present.  Skin: No rash.  Musculoskeletal: No edema.  Psychiatric: Appears normal.  Neurologic: Alert awake oriented to time place and person. Moves all extremities. No facial asymmetry. Tongue is midline.  Data Reviewed: Basic Metabolic Panel:  Recent Labs Lab 12/10/12 2244 12/11/12 0845 12/12/12 0543  NA 138 137 136  K 4.0 3.5 3.5  CL 95* 95* 97  CO2 29 25 25   GLUCOSE 108* 119* 113*  BUN 29* 28* 26*  CREATININE 1.20  1.08 1.15  CALCIUM 9.6 9.3 8.7   Liver Function Tests:  Recent Labs Lab 12/10/12 2244 12/12/12 0543  AST 59* 71*  ALT 20 24  ALKPHOS 35* 28*  BILITOT 0.5 0.4  PROT 8.2 6.7  ALBUMIN 3.9 2.9*   No results found for this basename: LIPASE, AMYLASE,  in the last 168 hours  Recent Labs Lab 12/11/12 0300  AMMONIA 22   CBC:  Recent Labs Lab 12/10/12 2244 12/11/12 0845 12/12/12 0543  WBC 11.4* 7.8 10.2  NEUTROABS 9.4* 7.3  --   HGB 13.1 11.1* 10.2*  HCT 38.0* 33.4* 29.5*  MCV 85.4 86.3 85.0  PLT 191 139* 136*   Cardiac Enzymes: No results found for this basename: CKTOTAL, CKMB, CKMBINDEX, TROPONINI,  in the last 168 hours BNP (last 3 results) No results found for this basename: PROBNP,  in the last 8760 hours CBG: No results found for this basename: GLUCAP,  in the last 168 hours  No results found for this or any previous visit (from the past 240 hour(s)).   Studies: Dg Chest 2 View  12/11/2012   CLINICAL DATA:  Multiple falls.  EXAM: CHEST  2 VIEW  COMPARISON:  Chest radiograph Jun 25, 2012.  FINDINGS: Cardiac silhouette is upper limits of normal, moderately calcified aorta. Similar chronic interstitial changes, with minimal probable left posterior costophrenic angle airspace opacity with trace pleural effusions. No pneumothorax. Biapical pleural parenchymal scarring.  Again noted  is left posterior 3rd and 4th rib fractures, more chronic appearing 7th and 8th rib fractures on the left. Remote distal left clavicle fracture, remote proximal left humerus fracture. Mild age indeterminate approximately T9 compression fracture. Soft tissue planes are nonsuspicious.  IMPRESSION: Chronic interstitial changes/emphysema with mild left lower lobe airspace opacity and small left pleural effusion concerning for pneumonia. Recommend followup chest radiograph after treatment to verify improvement.   Electronically Signed   By: Awilda Metro   On: 12/11/2012 03:36   Ct Head Wo  Contrast  12/11/2012   CLINICAL DATA:  Partially paralyzed, recent fall. No definite head trauma.  EXAM: CT HEAD WITHOUT CONTRAST  CT CERVICAL SPINE WITHOUT CONTRAST  TECHNIQUE: Multidetector CT imaging of the head and cervical spine was performed following the standard protocol without intravenous contrast. Multiplanar CT image reconstructions of the cervical spine were also generated.  COMPARISON:  CT of the head May 16, 2011.  FINDINGS: CT HEAD FINDINGS  No intraparenchymal hemorrhage, mass effect or midline shift. No acute large vascular territory infarct.  Status post bilateral craniectomies with dural flaps in place, which result in some streak artifact. Moderate ventriculomegaly, likely on the basis of parenchymal brain volume loss, with right parietal encephalomalacia and porencephaly. Bilateral inferior frontal lobe encephalomalacia, left posterior frontal lobe encephalomalacia is similar, the right temporal lobe encephalomalacia seen previously is less apparent due to a aforementioned artifact. 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  No abnormal extra-axial fluid collections. Basal cisterns are patent. Dense calcific atherosclerosis of the carotid siphons.  Status post left ocular lens implant, the ocular globes and orbital contents are nonsuspicious. Trace paranasal sinus mucosal thickening, without air-fluid levels. Small left frontal mucosal retention cyst. Mastoid air cells are well aerated. Patient is edentulous.  CT CERVICAL SPINE FINDINGS  Mild motion degraded examination.  Cervical vertebral bodies and posterior elements appear intact and aligned with straightened cervical lordosis. Severe C5-6 and C6-7 degenerative disc disease with ventral spurring, moderate at C3-4 and moderate to severe at C7-T1.  Bone mineral density is decreased without destructive bony lesions. C1-2 articulation maintained. Mild pannus about the odontoid  process can be seen with CPPD. Moderate calcific atherosclerosis of the left greater than right carotid bulbs.  Included view of the lungs demonstrates biapical pleural parenchymal scarring, moderate to severe centrilobular and paraseptal emphysema.  Degenerative disc disease and facet arthropathy result in moderate C5-6 and C6-7 canal stenosis, severe neural foraminal narrowing at these 2 levels.  IMPRESSION: CT Head: No acute intracranial process.  Status post bilateral cranioplasty's, with remote posttraumatic change.  CT cervical spine: Straightened cervical lordosis without fracture or malalignment.  Degenerative change of the cervical spine results in moderate C5-6 and C6-7 canal stenosis, severe neural foraminal narrowing at these 2 levels.  moderate C5-6 and C6-7 canal stenosis, severe neural foraminal narrowing at these 2 levels.  moderate C5-6 and C6-7 canal stenosis, severe neural foraminal narrowing at these 2 levels.   Electronically Signed   By: Awilda Metro   On: 12/11/2012 03:56   Ct Cervical Spine Wo Contrast  12/11/2012   CLINICAL DATA:  Partially paralyzed, recent fall. No definite head trauma.  EXAM: CT HEAD WITHOUT CONTRAST  CT CERVICAL SPINE WITHOUT CONTRAST  TECHNIQUE: Multidetector CT imaging of the head and cervical spine was performed following the standard protocol without intravenous contrast. Multiplanar CT image reconstructions of the cervical spine were also generated.  COMPARISON:  CT of the head May 16, 2011.  FINDINGS: CT HEAD FINDINGS  No intraparenchymal hemorrhage, mass effect or midline shift. No acute large vascular territory infarct.  Status post bilateral craniectomies with dural flaps in place, which result in some streak artifact. Moderate ventriculomegaly, likely on the basis of parenchymal brain volume loss, with right parietal encephalomalacia and porencephaly. Bilateral inferior frontal lobe encephalomalacia, left posterior frontal lobe encephalomalacia is  similar, the right temporal lobe encephalomalacia seen previously is less apparent due to a aforementioned artifact. 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  No abnormal extra-axial fluid collections. Basal cisterns are patent. Dense calcific atherosclerosis of the carotid siphons.  Status post left ocular lens implant, the ocular globes and orbital contents are nonsuspicious. Trace paranasal sinus mucosal thickening, without air-fluid levels. Small left frontal mucosal retention cyst. Mastoid air cells are well aerated. Patient is edentulous.  CT CERVICAL SPINE FINDINGS  Mild motion degraded examination.  Cervical vertebral bodies and posterior elements appear intact and aligned with straightened cervical lordosis. Severe C5-6 and C6-7 degenerative disc disease with ventral spurring, moderate at C3-4 and moderate to severe at C7-T1.  Bone mineral density is decreased without destructive bony lesions. C1-2 articulation maintained. Mild pannus about the odontoid process can be seen with CPPD. Moderate calcific atherosclerosis of the left greater than right carotid bulbs.  Included view of the lungs demonstrates biapical pleural parenchymal scarring, moderate to severe centrilobular and paraseptal emphysema.  Degenerative disc disease and facet arthropathy result in moderate C5-6 and C6-7 canal stenosis, severe neural foraminal narrowing at these 2 levels.  IMPRESSION: CT Head: No acute intracranial process.  Status post bilateral cranioplasty's, with remote posttraumatic change.  CT cervical spine: Straightened cervical lordosis without fracture or malalignment.  Degenerative change of the cervical spine results in moderate C5-6 and C6-7 canal stenosis, severe neural foraminal narrowing at these 2 levels.  moderate C5-6 and C6-7 canal stenosis, severe neural foraminal narrowing at these 2 levels.  moderate C5-6 and C6-7 canal stenosis, severe neural  foraminal narrowing at these 2 levels.   Electronically Signed   By: Awilda Metro   On: 12/11/2012 03:56   Mr Brain Wo Contrast  12/11/2012   CLINICAL DATA:  Stroke. Frequent falls over the last 2 days. Brief episode of slurred speech.  EXAM: MRI HEAD WITHOUT CONTRAST  TECHNIQUE: Multiplanar, multiecho pulse sequences of the brain and surrounding structures were obtained without intravenous contrast.  COMPARISON:  CT head without contrast from the same day. CT head without contrast 07/13/2006 at [facility]  FINDINGS: The study is moderately degraded by patient motion.  The patient is status post bilateral craniotomies for previous trauma. Expected dilation of the right lateral ventricle is again noted.  The diffusion-weighted imaging demonstrates no evidence for acute or subacute infarction. In addition to the atrophy there is moderate periventricular white matter disease. A remote left frontal lobe infarct is evident.  Flow is present in the major intracranial arteries. The patient is status post left lens extraction. Mild mucosal thickening is noted in the posterior right ethmoid air cells. There is some fluid in the right mastoid air cells.  IMPRESSION: 1. No acute intracranial abnormality or focal lesion to explain the patient's symptoms. 2. Remote encephalomalacia and stable ex vacuo dilation of the lateral ventricles, right greater than left. 3. Remote left frontal lobe infarct. 4. Status post bilateral craniotomies.   Electronically Signed   By: Gennette Pac M.D.   On: 12/11/2012 17:48  Scheduled Meds: . albuterol  2.5 mg Nebulization QID  . aspirin  300 mg Rectal Daily   Or  . aspirin  325 mg Oral Daily  . budesonide (PULMICORT) nebulizer solution  0.25 mg Nebulization BID  . enoxaparin (LOVENOX) injection  40 mg Subcutaneous Q24H  . ipratropium  0.5 mg Nebulization QID  . levofloxacin (LEVAQUIN) IV  750 mg Intravenous Q24H  . methylPREDNISolone (SOLU-MEDROL) injection  40  mg Intravenous Daily  . pantoprazole  40 mg Oral Q1200  . sertraline  100 mg Oral Daily  . thiamine  100 mg Oral Daily   Continuous Infusions: . sodium chloride      Principal Problem:   Gait difficulty Active Problems:   COPD   Pneumonia   [facility]  [facility] Pager [phone]. If 7PM-7AM, please contact night-coverage at [email or web address], password [facility] 12/12/2012, 12:33 PM  LOS: 1 day

## 2012-12-13 ENCOUNTER — Inpatient Hospital Stay (HOSPITAL_COMMUNITY): Payer: Medicare Other

## 2012-12-13 DIAGNOSIS — F17200 Nicotine dependence, unspecified, uncomplicated: Secondary | ICD-10-CM

## 2012-12-13 DIAGNOSIS — F1010 Alcohol abuse, uncomplicated: Secondary | ICD-10-CM

## 2012-12-13 DIAGNOSIS — I872 Venous insufficiency (chronic) (peripheral): Secondary | ICD-10-CM

## 2012-12-13 MED ORDER — LEVOFLOXACIN 750 MG PO TABS
750.0000 mg | ORAL_TABLET | Freq: Every day | ORAL | Status: DC
  Administered 2012-12-14 – 2012-12-16 (×3): 750 mg via ORAL
  Filled 2012-12-13 (×3): qty 1

## 2012-12-13 MED ORDER — MOMETASONE FURO-FORMOTEROL FUM 100-5 MCG/ACT IN AERO
2.0000 | INHALATION_SPRAY | Freq: Two times a day (BID) | RESPIRATORY_TRACT | Status: DC
  Administered 2012-12-13 – 2012-12-16 (×7): 2 via RESPIRATORY_TRACT
  Filled 2012-12-13: qty 8.8

## 2012-12-13 MED ORDER — IPRATROPIUM BROMIDE 0.02 % IN SOLN
0.5000 mg | Freq: Three times a day (TID) | RESPIRATORY_TRACT | Status: DC
  Administered 2012-12-13 – 2012-12-16 (×9): 0.5 mg via RESPIRATORY_TRACT
  Filled 2012-12-13 (×9): qty 2.5

## 2012-12-13 MED ORDER — ALBUTEROL SULFATE (5 MG/ML) 0.5% IN NEBU
2.5000 mg | INHALATION_SOLUTION | Freq: Three times a day (TID) | RESPIRATORY_TRACT | Status: DC
  Administered 2012-12-13 – 2012-12-16 (×9): 2.5 mg via RESPIRATORY_TRACT
  Filled 2012-12-13 (×9): qty 0.5

## 2012-12-13 MED ORDER — PREDNISONE 20 MG PO TABS
20.0000 mg | ORAL_TABLET | Freq: Every day | ORAL | Status: DC
  Administered 2012-12-14 – 2012-12-16 (×3): 20 mg via ORAL
  Filled 2012-12-13 (×4): qty 1

## 2012-12-13 NOTE — Progress Notes (Signed)
Patient requested PRN nebulizer tx due to SOB.

## 2012-12-13 NOTE — Progress Notes (Signed)
Ambulated 100 ft with patient using rolling walker. Patient has previous left sided weakness and tends to drag left leg at times. "It's been giving out on me lately." Returned to bed. Bed alarm set. Call bell and phone near. Will continue to monitor.Mamie Levers

## 2012-12-13 NOTE — Progress Notes (Signed)
[facility] PROGRESS NOTE  Antonio Marshall ZOX:[id number] DOB: 1940/11/10 DOA: 12/11/2012 PCP: Michele Mcalpine, MD  Assessment/Plan: 1. Gait instability - MRI brain negative for acute CVA. MRI c/spine pending. Physical therapy consult recommending SNF as pt is at high risk for falling. Will consult CSW for SNF placement. 2. Pneumonia - patient has been placed on Levaquin. Influenza PCR negative. Repeat CXR today. Change to p.o. 3. COPD with mild exacerbation - improving, de-escalate neb treatments, resume home advair, wean steroid 4. Previous history of alcoholism - patient has quit drinking more than a year ago. Patient has been placed on thiamine. 5. History of brain trauma. 6. History of hyperlipidemia on diet.  Code Status: Full  Family Communication: daughter  HPI/Subjective: Pt reports that his breathing has been better.  He is sitting up in a chair.  Unsteady on his feet.  No distress.   Objective: Filed Vitals:   12/13/12 0425  BP: 136/69  Pulse: 71  Temp: 98.6 F (37 C)  Resp: 17    Intake/Output Summary (Last 24 hours) at 12/13/12 0924 Last data filed at 12/13/12 0700  Gross per 24 hour  Intake    360 ml  Output   1376 ml  Net  -1016 ml   Filed Weights   12/11/12 0833 12/12/12 0352 12/13/12 0425  Weight: 130 lb 1.1 oz (59 kg) 133 lb 6.1 oz (60.5 kg) 132 lb 4.4 oz (60 kg)    Exam:  General: Well-developed and nourished.  Eyes: Anicteric no pallor.  ENT: No discharge from the ears eyes nose or mouth. Poor dentition.  Neck: No mass felt.   Cardiovascular: normal S1-S2 heard.  Respiratory: BBS clear, no wheezes heard today. Abdomen: Soft nontender bowel sounds present.  Skin: No rash.  Musculoskeletal: No edema.  Psychiatric: Appears normal.  Neurologic: Alert awake oriented to time place and person. Moves all extremities. No facial asymmetry. Tongue is midline.  Data Reviewed: Basic Metabolic Panel:  Recent Labs Lab 12/10/12 2244 12/11/12 0845  12/12/12 0543  NA 138 137 136  K 4.0 3.5 3.5  CL 95* 95* 97  CO2 29 25 25   GLUCOSE 108* 119* 113*  BUN 29* 28* 26*  CREATININE 1.20 1.08 1.15  CALCIUM 9.6 9.3 8.7   Liver Function Tests:  Recent Labs Lab 12/10/12 2244 12/12/12 0543  AST 59* 71*  ALT 20 24  ALKPHOS 35* 28*  BILITOT 0.5 0.4  PROT 8.2 6.7  ALBUMIN 3.9 2.9*   No results found for this basename: LIPASE, AMYLASE,  in the last 168 hours  Recent Labs Lab 12/11/12 0300  AMMONIA 22   CBC:  Recent Labs Lab 12/10/12 2244 12/11/12 0845 12/12/12 0543  WBC 11.4* 7.8 10.2  NEUTROABS 9.4* 7.3  --   HGB 13.1 11.1* 10.2*  HCT 38.0* 33.4* 29.5*  MCV 85.4 86.3 85.0  PLT 191 139* 136*   Cardiac Enzymes: No results found for this basename: CKTOTAL, CKMB, CKMBINDEX, TROPONINI,  in the last 168 hours BNP (last 3 results) No results found for this basename: PROBNP,  in the last 8760 hours CBG: No results found for this basename: GLUCAP,  in the last 168 hours  No results found for this or any previous visit (from the past 240 hour(s)).   Studies: Mr Brain Wo Contrast  12/11/2012   CLINICAL DATA:  Stroke. Frequent falls over the last 2 days. Brief episode of slurred speech.  EXAM: MRI HEAD WITHOUT CONTRAST  TECHNIQUE: Multiplanar, multiecho pulse sequences of the  brain and surrounding structures were obtained without intravenous contrast.  COMPARISON:  CT head without contrast from the same day. CT head without contrast 07/13/2006 at [facility].  FINDINGS: The study is moderately degraded by patient motion.  The patient is status post bilateral craniotomies for previous trauma. Expected dilation of the right lateral ventricle is again noted.  The diffusion-weighted imaging demonstrates no evidence for acute or subacute infarction. In addition to the atrophy there is moderate periventricular white matter disease. A remote left frontal lobe infarct is evident.  Flow is present in the major intracranial arteries.  The patient is status post left lens extraction. Mild mucosal thickening is noted in the posterior right ethmoid air cells. There is some fluid in the right mastoid air cells.  IMPRESSION: 1. No acute intracranial abnormality or focal lesion to explain the patient's symptoms. 2. Remote encephalomalacia and stable ex vacuo dilation of the lateral ventricles, right greater than left. 3. Remote left frontal lobe infarct. 4. Status post bilateral craniotomies.   Electronically Signed   By: Gennette Pac M.D.   On: 12/11/2012 17:48    Scheduled Meds: . albuterol  2.5 mg Nebulization TID  . aspirin  300 mg Rectal Daily   Or  . aspirin  325 mg Oral Daily  . enoxaparin (LOVENOX) injection  40 mg Subcutaneous Q24H  . ipratropium  0.5 mg Nebulization TID  . [START ON 12/14/2012] levofloxacin  750 mg Oral Daily  . mometasone-formoterol  2 puff Inhalation BID  . pantoprazole  40 mg Oral Q1200  . [START ON 12/14/2012] predniSONE  20 mg Oral Q breakfast  . sertraline  100 mg Oral Daily   Continuous Infusions: . sodium chloride      Principal Problem:   Gait difficulty Active Problems:   COPD   Pneumonia   [facility] Pager [phone]. If 7PM-7AM, please contact night-coverage at [email or web address], password [facility] 12/13/2012, 9:24 AM  LOS: 2 days

## 2012-12-13 NOTE — Progress Notes (Signed)
Occupational Therapy Treatment Patient Details Name: Antonio Marshall MRN: [id number] DOB: [birth year] Today's Date: 12/13/2012 Time: 4098-1191 OT Time Calculation (min): 23 min  OT Assessment / Plan / Recommendation  History of present illness Antonio Marshall is a 72 y.o. male with known history of COPD and ongoing tobacco abuse, history of head trauma, hyperlipidemia on diet and previous history of alcohol abuse quit one year ago was brought to the ER after patient has been having frequent falls over the last 2 days. As per the patient's daughter with whom I spoke patient has been having frequent falls and yesterday in the morning on 5 AM patient also had brief episode of slurred speech. He did not lose consciousness. Patient has mild weakness of the left lower extremity from previous trauma. In addition patient has been having some productive cough for last 3 days. In the ER patient was found to have mild wheeze and was treated with nebulizer and Solu-Medrol and started on antibiotics for pneumonia. CT head did not show anything acute and C-spine showed canal stenosis and narrowing.    OT comments  Pt still exhibits significant balance issues with dynamic standing tasks and functional transfers.  Overall needs at least min assist level and use of an assistive device.  Recommend continued rehab at SNF level or with 24 hour supervision if discharged home.  Follow Up Recommendations  Supervision/Assistance - 24 hour;SNF       Equipment Recommendations  None recommended by OT       Frequency Min 2X/week   Progress towards OT Goals Progress towards OT goals: Progressing toward goals  Plan Discharge plan remains appropriate    Precautions / Restrictions Precautions Precautions: Fall Restrictions Weight Bearing Restrictions: No   Pertinent Vitals/Pain No report pain during session, O2 sats 94% on room air and HR 97 BPM    ADL  Toilet Transfer: Simulated;Minimal assistance Toilet  Transfer Method: Other (comment) (ambulate with use of the RW) Transfers/Ambulation Related to ADLs: Pt is able to ambulate using the RW with min assist level. ADL Comments: Pt still with flexed posture with standing and mobility.  Can perform some self correction with cueing and min facilitation but does not maintain.  Needs mod instructional cueing to utilize the RW correctly as he tends to step outside of it at times during transfers.       Visit Information  Last OT Received On: 12/13/12 Assistance Needed: +1 History of Present Illness: Antonio Marshall is a 72 y.o. male with known history of COPD and ongoing tobacco abuse, history of head trauma, hyperlipidemia on diet and previous history of alcohol abuse quit one year ago was brought to the ER after patient has been having frequent falls over the last 2 days. As per the patient's daughter with whom I spoke patient has been having frequent falls and yesterday in the morning on 5 AM patient also had brief episode of slurred speech. He did not lose consciousness. Patient has mild weakness of the left lower extremity from previous trauma. In addition patient has been having some productive cough for last 3 days. In the ER patient was found to have mild wheeze and was treated with nebulizer and Solu-Medrol and started on antibiotics for pneumonia. CT head did not show anything acute and C-spine showed canal stenosis and narrowing.           Cognition  Cognition Arousal/Alertness: Awake/alert Behavior During Therapy: WFL for tasks assessed/performed Overall Cognitive Status: History of cognitive impairments -  at baseline    Mobility  Transfers Transfers: Sit to Stand;Stand to Sit Sit to Stand: From chair/3-in-1;4: Min assist;With upper extremity assist Stand to Sit: 4: Min assist;With upper extremity assist;To chair/3-in-1 Details for Transfer Assistance: Pt needs mod instructional cueing to turn around and align himself up with the surface  of the chair as well as reaching back to the chair to sit.       Balance Balance Balance Assessed: Yes Dynamic Standing Balance Dynamic Standing - Balance Support: Right upper extremity supported Dynamic Standing - Level of Assistance: 4: Min assist Dynamic Standing - Balance Activities: Reaching for objects Dynamic Standing - Comments: Pt ambulated to the refrigirator to get his Pepsi with min hand held assist.     End of Session OT - End of Session Activity Tolerance: Patient tolerated treatment well Patient left: in chair;with call bell/phone within reach Nurse Communication: Mobility status     Duquan Gillooly OTR/L 12/13/2012, 1:56 PM

## 2012-12-14 LAB — COMPREHENSIVE METABOLIC PANEL
AST: 41 U/L — ABNORMAL HIGH (ref 0–37)
Albumin: 2.8 g/dL — ABNORMAL LOW (ref 3.5–5.2)
BUN: 23 mg/dL (ref 6–23)
Chloride: 99 mEq/L (ref 96–112)
Creatinine, Ser: 1.11 mg/dL (ref 0.50–1.35)
GFR calc Af Amer: 74 mL/min — ABNORMAL LOW (ref >90)
GFR calc non Af Amer: 64 mL/min — ABNORMAL LOW (ref >90)
Glucose, Bld: 88 mg/dL (ref 70–99)
Potassium: 3.7 mEq/L (ref 3.5–5.1)
Total Bilirubin: 0.3 mg/dL (ref 0.3–1.2)

## 2012-12-14 LAB — CK: Total CK: 317 U/L — ABNORMAL HIGH (ref 7–232)

## 2012-12-14 MED ORDER — ACETAMINOPHEN 325 MG PO TABS
650.0000 mg | ORAL_TABLET | Freq: Once | ORAL | Status: AC
  Administered 2012-12-14: 650 mg via ORAL
  Filled 2012-12-14: qty 2

## 2012-12-14 MED ORDER — ACETAMINOPHEN 325 MG PO TABS: 650.0000 mg | ORAL_TABLET | Freq: Three times a day (TID) | ORAL | Status: DC | PRN

## 2012-12-14 MED ORDER — ACETAMINOPHEN 325 MG PO TABS
650.0000 mg | ORAL_TABLET | ORAL | Status: DC | PRN
  Administered 2012-12-14: 650 mg via ORAL
  Filled 2012-12-14: qty 2

## 2012-12-14 MED ORDER — LORAZEPAM 0.5 MG PO TABS
0.5000 mg | ORAL_TABLET | ORAL | Status: DC | PRN
  Administered 2012-12-14: 0.5 mg via ORAL
  Filled 2012-12-14: qty 1

## 2012-12-14 NOTE — Progress Notes (Signed)
ANTIBIOTIC CONSULT NOTE - FOLLOW UP  Pharmacy Consult for levaquin Indication: pneumonia  Allergies  Allergen Reactions  . Neomycin-Bacitracin Zn-Polymyx Rash    Patient Measurements: Height: 5\' 10"  (177.8 cm) Weight: 132 lb 4.4 oz (60 kg) IBW/kg (Calculated) : 73   Vital Signs: Temp: 97.9 F (36.6 C) (11/15 0500) Temp src: Oral (11/15 0500) BP: 98/61 mmHg (11/15 0937) Pulse Rate: 83 (11/15 0500) Intake/Output from previous day: 11/14 0701 - 11/15 0700 In: 240 [P.O.:240] Out: 700 [Urine:700] Intake/Output from this shift: Total I/O In: -  Out: 1 [Stool:1]  Labs:  Recent Labs  12/12/12 0543 12/14/12 0410  WBC 10.2  --   HGB 10.2*  --   PLT 136*  --   CREATININE 1.15 1.11   Estimated Creatinine Clearance: 50.3 ml/min (by C-G formula based on Cr of 1.11). No results found for this basename: VANCOTROUGH, VANCOPEAK, VANCORANDOM, GENTTROUGH, GENTPEAK, GENTRANDOM, TOBRATROUGH, TOBRAPEAK, TOBRARND, AMIKACINPEAK, AMIKACINTROU, AMIKACIN,  in the last 72 hours   Microbiology: No results found for this or any previous visit (from the past 720 hour(s)).  Anti-infectives   Start     Dose/Rate Route Frequency Ordered Stop   12/14/12 1000  levofloxacin (LEVAQUIN) tablet 750 mg     750 mg Oral Daily 12/13/12 0921     12/12/12 0600  levofloxacin (LEVAQUIN) IVPB 750 mg  Status:  Discontinued     750 mg 100 mL/hr over 90 Minutes Intravenous Every 24 hours 12/11/12 0644 12/13/12 0921   12/11/12 0430  levofloxacin (LEVAQUIN) IVPB 750 mg     750 mg 100 mL/hr over 90 Minutes Intravenous  Once 12/11/12 0426 12/11/12 0656      Assessment: Patient is a 72 y.o M on Levaquin day #4 for PNA.  He remains afebrile with wbc wnl.  No cultures.  Est crcl~ 50.  Plan:  1) continue Levaquin 750 mg PO q24h  2) please indicate LOT for abx  Burnell Matlin P 12/14/2012,12:13 PM

## 2012-12-14 NOTE — Progress Notes (Signed)
Pt had c/o knee pain [facility] notified and orders given will continue to monitor. Ilean Skill LPN

## 2012-12-14 NOTE — Progress Notes (Signed)
Clinical Social Work Department CLINICAL SOCIAL WORK PLACEMENT NOTE 12/14/2012  Patient:  Antonio Marshall, Antonio Marshall  Account Number:  [id number] Admit date:  12/10/2012  Clinical Social Worker:  Samuella Bruin, Theresia Majors  Date/time:  12/14/2012 05:53 PM  Clinical Social Work is seeking post-discharge placement for this patient at the following level of care:   SKILLED NURSING   (*CSW will update this form in Epic as items are completed)   12/14/2012  Patient/family provided with [facility] Department of Clinical Social Work's list of facilities offering this level of care within the geographic area requested by the patient (or if unable, by the patient's family).  12/14/2012  Patient/family informed of their freedom to choose among providers that offer the needed level of care, that participate in Medicare, Medicaid or managed care program needed by the patient, have an available bed and are willing to accept the patient.    Patient/family informed of [facility]' ownership interest in [facility], as well as of the fact that they are under no obligation to receive care at this facility.  PASARR submitted to EDS on 12/14/2012 PASARR number received from EDS on   FL2 transmitted to all facilities in geographic area requested by pt/family on  12/14/2012 FL2 transmitted to all facilities within larger geographic area on   Patient informed that his/her managed care company has contracts with or will negotiate with  certain facilities, including the following:     Patient/family informed of bed offers received:   Patient chooses bed at  Physician recommends and patient chooses bed at    Patient to be transferred to  on   Patient to be transferred to facility by   The following physician request were entered in Epic:   Additional Comments:  Samuella Bruin, MSW, LCSWA Clinical Social Worker [facility] Emergency Dept. [phone]

## 2012-12-14 NOTE — Progress Notes (Signed)
[facility] PROGRESS NOTE  Antonio Marshall ZOX:[id number] DOB: 07-Feb-1940 DOA: 12/11/2012 PCP: Michele Mcalpine, MD  Assessment/Plan: 1. Gait instability - MRI brain negative for acute CVA. MRI c/spine pending. Physical therapy consult recommending SNF as pt is at high risk for falling. Will consult CSW for SNF placement. 2. Pneumonia - patient has been placed on Levaquin. Influenza PCR negative. Change to p.o. 3. COPD with mild exacerbation - improving, de-escalate neb treatments, resume home advair, wean steroid 4. Previous history of alcoholism - patient has quit drinking more than a year ago. Patient has been placed on thiamine. 5. History of brain trauma. 6. History of hyperlipidemia on diet.  Code Status: Full  Family Communication: no family at bedside Disposition: to SNF when bed available.  HPI/Subjective: Pt had some knee pain overnight but otherwise no complaints  Objective: Filed Vitals:   12/14/12 0500  BP: 135/75  Pulse: 83  Temp: 97.9 F (36.6 C)  Resp: 19    Intake/Output Summary (Last 24 hours) at 12/14/12 0755 Last data filed at 12/13/12 1800  Gross per 24 hour  Intake    240 ml  Output    700 ml  Net   -460 ml   Filed Weights   12/11/12 0833 12/12/12 0352 12/13/12 0425  Weight: 130 lb 1.1 oz (59 kg) 133 lb 6.1 oz (60.5 kg) 132 lb 4.4 oz (60 kg)    Exam:  General: Well-developed and nourished.  Eyes: Anicteric no pallor. ENT: No discharge from the ears eyes nose or mouth. Poor dentition.  Neck: No mass felt.  Cardiovascular: normal S1-S2 heard.  Respiratory: BBS clear, no wheezes heard today.  Abdomen: Soft nontender bowel sounds present.  Skin: No rash.  Musculoskeletal: No edema.  Psychiatric: Appears normal.  Neurologic: Alert awake oriented to time place and person. Moves all extremities. No facial asymmetry. Tongue is midline.   Data Reviewed: Basic Metabolic Panel:  Recent Labs Lab 12/10/12 2244 12/11/12 0845 12/12/12 0543  12/14/12 0410  NA 138 137 136 135  K 4.0 3.5 3.5 3.7  CL 95* 95* 97 99  CO2 29 25 25 25   GLUCOSE 108* 119* 113* 88  BUN 29* 28* 26* 23  CREATININE 1.20 1.08 1.15 1.11  CALCIUM 9.6 9.3 8.7 8.9   Liver Function Tests:  Recent Labs Lab 12/10/12 2244 12/12/12 0543 12/14/12 0410  AST 59* 71* 41*  ALT 20 24 29   ALKPHOS 35* 28* 27*  BILITOT 0.5 0.4 0.3  PROT 8.2 6.7 6.5  ALBUMIN 3.9 2.9* 2.8*   No results found for this basename: LIPASE, AMYLASE,  in the last 168 hours  Recent Labs Lab 12/11/12 0300  AMMONIA 22   CBC:  Recent Labs Lab 12/10/12 2244 12/11/12 0845 12/12/12 0543  WBC 11.4* 7.8 10.2  NEUTROABS 9.4* 7.3  --   HGB 13.1 11.1* 10.2*  HCT 38.0* 33.4* 29.5*  MCV 85.4 86.3 85.0  PLT 191 139* 136*   Cardiac Enzymes:  Recent Labs Lab 12/14/12 0410  CKTOTAL 317*   BNP (last 3 results) No results found for this basename: PROBNP,  in the last 8760 hours CBG: No results found for this basename: GLUCAP,  in the last 168 hours  No results found for this or any previous visit (from the past 240 hour(s)).   Studies: No results found.  Scheduled Meds: . albuterol  2.5 mg Nebulization TID  . aspirin  300 mg Rectal Daily   Or  . aspirin  325 mg Oral Daily  .  enoxaparin (LOVENOX) injection  40 mg Subcutaneous Q24H  . ipratropium  0.5 mg Nebulization TID  . levofloxacin  750 mg Oral Daily  . mometasone-formoterol  2 puff Inhalation BID  . pantoprazole  40 mg Oral Q1200  . predniSONE  20 mg Oral Q breakfast  . sertraline  100 mg Oral Daily   Continuous Infusions: . sodium chloride      Principal Problem:   Gait difficulty Active Problems:   COPD   Pneumonia   Dannelle Rhymes [facility]  [facility] Pager [phone]. If 7PM-7AM, please contact night-coverage at [email or web address], password [facility] 12/14/2012, 7:55 AM  LOS: 3 days

## 2012-12-14 NOTE — Progress Notes (Signed)
Clinical Social Work Department BRIEF PSYCHOSOCIAL ASSESSMENT 12/14/2012  Patient:  Antonio Marshall, Antonio Marshall     Account Number:  [id number]     Admit date:  12/10/2012  Clinical Social Worker:  Hadley Pen  Date/Time:  12/14/2012 05:09 PM  Referred by:  Physician  Date Referred:  12/13/2012 Referred for  SNF Placement   Other Referral:   Interview type:  Patient Other interview type:   Daughter Antonio Marshall [phone]    PSYCHOSOCIAL DATA Living Status:  FAMILY Admitted from facility:   Level of care:   Primary support name:  Antonio Marshall [phone] Primary support relationship to patient:  CHILD, ADULT Degree of support available:   Good    CURRENT CONCERNS Current Concerns  Post-Acute Placement   Other Concerns:    SOCIAL WORK ASSESSMENT / PLAN Weekend CSW met with patient to discuss SNF placement at discharge. Patient appeared agreeable but directed CSW to speak to his daughter Antonio Marshall. CSW contacted daughter Antonio Marshall via telephone regarding SNF placement, Daughter states that her father and her are agreeable to SNF and prefer [facility]. CSW explained that it is beneficial to fax out to [facility] for most options, daughter agreed. Weekday CSW to follow for discharge planning.   Assessment/plan status:  Information/Referral to [facility] Other assessment/ plan:   Information/referral to community resources:   Weekend CSW provided patient with [place] SNF list.    PATIENT'S/FAMILY'S RESPONSE TO PLAN OF CARE: Patient and daughter were both agreeable to SNF, preferablly [facility]. They both vocalized their appreciation for CSW assistance.    Samuella Bruin, MSW, LCSWA Clinical Social Worker [facility] Emergency Dept. [phone]

## 2012-12-15 DIAGNOSIS — F411 Generalized anxiety disorder: Secondary | ICD-10-CM

## 2012-12-15 NOTE — Progress Notes (Signed)
Ambulated 150 ft using walker with RN standing by. Patient tends to drag left leg while ambulating. Returned to bedside chair w/out incidence. Call bell near. Will continue to monitor.Antonio Marshall

## 2012-12-15 NOTE — Progress Notes (Signed)
[facility] PROGRESS NOTE  Antonio Marshall WGN:[id number] DOB: 1940/04/06 DOA: 12/11/2012 PCP: Michele Mcalpine, MD  Assessment/Plan: 1. Gait instability - MRI brain negative for acute CVA. MRI c/spine pending. Physical therapy consult recommending SNF as pt is at high risk for falling. Consulted CSW for SNF placement. 2. Pneumonia - patient has been placed on Levaquin. Influenza PCR negative. Change to p.o. 3. COPD with mild exacerbation - improving, de-escalate neb treatments, resume home advair, wean steroid 4. Previous history of alcoholism - patient has quit drinking more than a year ago. Patient has been placed on thiamine. 5. History of brain trauma. 6. History of hyperlipidemia on diet.- Lipid panel reviewed and optimally controlled.   Code Status: Full  Family Communication: no family at bedside  Disposition: to SNF when bed available, anticipate tomorrow 11/17  HPI/Subjective: Pt without complaints today.  Had a mild headache yesterday but since has resolved.   Objective: Filed Vitals:   12/15/12 0425  BP: 122/65  Pulse: 87  Temp: 97.9 F (36.6 C)  Resp: 19    Intake/Output Summary (Last 24 hours) at 12/15/12 0716 Last data filed at 12/15/12 0426  Gross per 24 hour  Intake      0 ml  Output   1201 ml  Net  -1201 ml   Filed Weights   12/11/12 0833 12/12/12 0352 12/13/12 0425  Weight: 130 lb 1.1 oz (59 kg) 133 lb 6.1 oz (60.5 kg) 132 lb 4.4 oz (60 kg)    Exam:  General: Well-developed and nourished.  Eyes: Anicteric no pallor.  ENT: No discharge from the ears eyes nose or mouth. Poor dentition.  Neck: No mass felt.  Cardiovascular: normal S1-S2 heard.  Respiratory: BBS clear, no wheezes heard today.  Abdomen: Soft nontender bowel sounds present.  Skin: No rash.  Musculoskeletal: No edema.  Psychiatric: Appears normal.  Neurologic: Alert awake oriented to time place and person. Moves all extremities. No facial asymmetry. Tongue is midline.  Data  Reviewed: Basic Metabolic Panel:  Recent Labs Lab 12/10/12 2244 12/11/12 0845 12/12/12 0543 12/14/12 0410  NA 138 137 136 135  K 4.0 3.5 3.5 3.7  CL 95* 95* 97 99  CO2 29 25 25 25   GLUCOSE 108* 119* 113* 88  BUN 29* 28* 26* 23  CREATININE 1.20 1.08 1.15 1.11  CALCIUM 9.6 9.3 8.7 8.9   Liver Function Tests:  Recent Labs Lab 12/10/12 2244 12/12/12 0543 12/14/12 0410  AST 59* 71* 41*  ALT 20 24 29   ALKPHOS 35* 28* 27*  BILITOT 0.5 0.4 0.3  PROT 8.2 6.7 6.5  ALBUMIN 3.9 2.9* 2.8*   No results found for this basename: LIPASE, AMYLASE,  in the last 168 hours  Recent Labs Lab 12/11/12 0300  AMMONIA 22   CBC:  Recent Labs Lab 12/10/12 2244 12/11/12 0845 12/12/12 0543  WBC 11.4* 7.8 10.2  NEUTROABS 9.4* 7.3  --   HGB 13.1 11.1* 10.2*  HCT 38.0* 33.4* 29.5*  MCV 85.4 86.3 85.0  PLT 191 139* 136*   Cardiac Enzymes:  Recent Labs Lab 12/14/12 0410  CKTOTAL 317*   BNP (last 3 results) No results found for this basename: PROBNP,  in the last 8760 hours CBG: No results found for this basename: GLUCAP,  in the last 168 hours  No results found for this or any previous visit (from the past 240 hour(s)).   Studies: No results found.  Scheduled Meds: . albuterol  2.5 mg Nebulization TID  . aspirin  300 mg Rectal Daily   Or  . aspirin  325 mg Oral Daily  . enoxaparin (LOVENOX) injection  40 mg Subcutaneous Q24H  . ipratropium  0.5 mg Nebulization TID  . levofloxacin  750 mg Oral Daily  . mometasone-formoterol  2 puff Inhalation BID  . pantoprazole  40 mg Oral Q1200  . predniSONE  20 mg Oral Q breakfast  . sertraline  100 mg Oral Daily   Continuous Infusions: . sodium chloride     Principal Problem:   Gait difficulty Active Problems:   COPD   Pneumonia  [facility]  [facility] Pager [phone]. If 7PM-7AM, please contact night-coverage at [email or web address], password [facility] 12/15/2012, 7:16 AM  LOS: 4 days

## 2012-12-15 NOTE — Progress Notes (Signed)
Assisted patient out of bed to chair for breakfast. Assessed per flow sheet. Denies any distress at this time. Call bell near. Will continue to monitor.Antonio Marshall

## 2012-12-16 LAB — VITAMIN B12: Vitamin B-12: 342 pg/mL (ref 211–911)

## 2012-12-16 MED ORDER — ACETAMINOPHEN 325 MG PO TABS: 650.0000 mg | ORAL_TABLET | ORAL | Status: AC | PRN

## 2012-12-16 MED ORDER — PREDNISONE 10 MG PO TABS: 10.0000 mg | ORAL_TABLET | Freq: Every day | ORAL | Status: DC

## 2012-12-16 MED ORDER — ALBUTEROL SULFATE 0.63 MG/3ML IN NEBU: 1.0000 | INHALATION_SOLUTION | Freq: Three times a day (TID) | RESPIRATORY_TRACT | Status: DC

## 2012-12-16 NOTE — Progress Notes (Signed)
Clinical Social Work Department CLINICAL SOCIAL WORK PLACEMENT NOTE 12/16/2012  Patient:  Antonio Marshall, Antonio Marshall  Account Number:  [id number] Admit date:  12/10/2012  Clinical Social Worker:  Samuella Bruin, Theresia Majors  Date/time:  12/14/2012 05:53 PM  Clinical Social Work is seeking post-discharge placement for this patient at the following level of care:   SKILLED NURSING   (*CSW will update this form in Epic as items are completed)   12/14/2012  Patient/family provided with [facility] Department of Clinical Social Work's list of facilities offering this level of care within the geographic area requested by the patient (or if unable, by the patient's family).  12/14/2012  Patient/family informed of their freedom to choose among providers that offer the needed level of care, that participate in Medicare, Medicaid or managed care program needed by the patient, have an available bed and are willing to accept the patient.    Patient/family informed of [facility]' ownership interest in [facility], as well as of the fact that they are under no obligation to receive care at this facility.  PASARR submitted to EDS on 12/14/2012 PASARR number received from EDS on   FL2 transmitted to all facilities in geographic area requested by pt/family on  12/14/2012 FL2 transmitted to all facilities within larger geographic area on   Patient informed that his/her managed care company has contracts with or will negotiate with  certain facilities, including the following:     Patient/family informed of bed offers received:  12/16/2012 Patient chooses bed at [facility] AND [facility] Physician recommends and patient chooses bed at    Patient to be transferred to [facility] AND REHAB on  12/16/2012 Patient to be transferred to facility by EMS  The following physician request were entered in Epic:   Additional Comments:  Maree Krabbe, MSW, Theresia Majors [phone]

## 2012-12-16 NOTE — Discharge Summary (Signed)
Physician Discharge Summary  Antonio Marshall UEA:[id number] DOB: 04-13-1940 DOA: 12/11/2012  PCP: Michele Mcalpine, MD  Admit date: 12/11/2012 Discharge date: 12/16/2012  Time spent: greater than 30 minutes  Recommendations for Outpatient Follow-up:  1. Follow up B12, thiamine and RBC folate. 2. Consider open MRI of c spine if above labs are normal.  Discharge Diagnoses:  Principal Problem:   Gait difficulty Active Problems:   COPD exacerbation  Community-acquired Pneumonia alcohol abuse, reportedly in remission History of neuropathy  Discharge Condition: *stable  Filed Weights   12/11/12 0833 12/12/12 0352 12/13/12 0425  Weight: 59 kg (130 lb 1.1 oz) 60.5 kg (133 lb 6.1 oz) 60 kg (132 lb 4.4 oz)    History of present illness:  72 y.o. male with known history of COPD and ongoing tobacco abuse, history of head trauma, hyperlipidemia on diet and previous history of alcohol abuse quit one year ago was brought to the ER after patient has been having frequent falls over the last 2 days. As per the patient's daughter with whom I spoke patient has been having frequent falls and yesterday in the morning on 5 AM patient also had brief episode of slurred speech. He did not lose consciousness. Patient has mild weakness of the left lower extremity from previous trauma. In addition patient has been having some productive cough for last 3 days. In the ER patient was found to have mild wheeze and was treated with nebulizer and Solu-Medrol and started on antibiotics for pneumonia. CT head did not show anything acute and C-spine showed canal stenosis and narrowing. Patient has been on it for further workup. Patient at this time denies any chest pain headache nausea vomiting abdominal pain diarrhea.   Hospital Course:  Patient was admitted to telemetry. MRI brain negative for acute stroke. MRI of the C-spine was attempted, but patient was unable to cooperate. Patient worked with physical therapy and  occupational therapy who recommended short-term skilled nursing facility placement which has been arranged by social work. A B12 level, thiamine, and folate are pending. TSH normal. May be related to his history of previous alcohol abuse and neuropathy as well as pneumonia. However, C-spine shows canal stenosis, and patient may benefit from open MRI if B12, thiamine, and folate levels are normal.  Patient was started on levofloxacin. Influenza PCR was negative. Patient has completed his course of antibiotic. Patient was given bronchodilators and steroids for his COPD exacerbation. His prednisone is being weaned.  Other medical issues remained stable.  Procedures:  none  Consultations:  PT, OT, SW  Discharge Exam: Filed Vitals:   12/16/12 0430  BP: 116/65  Pulse: 76  Temp: 97.7 F (36.5 C)  Resp: 19    General: in chair. Alert and comfortable Cardiovascular: RRR without WRR Respiratory: CTA without WRR Abd: S, NT, ND Ext no CCE  Discharge Instructions  Discharge Orders   Future Appointments Provider Department Dept Phone   12/25/2012 11:30 AM Michele Mcalpine, MD  [facility] [phone]   Future Orders Complete By Expires   Diet general  As directed    Increase activity slowly  As directed        Medication List    STOP taking these medications       methylPREDNISolone 4 MG tablet  Commonly known as:  MEDROL      TAKE these medications       acetaminophen 325 MG tablet  Commonly known as:  TYLENOL  Take 2 tablets (650 mg total) by  mouth every 4 (four) hours as needed for mild pain, moderate pain or headache.     albuterol 0.63 MG/3ML nebulizer solution  Commonly known as:  ACCUNEB  Take 3 mLs (0.63 mg total) by nebulization 3 (three) times daily.     fluticasone-salmeterol 115-21 MCG/ACT inhaler  Commonly known as:  ADVAIR HFA  Inhale 2 puffs into the lungs 2 (two) times daily.     ipratropium 0.02 % nebulizer solution  Commonly known as:   ATROVENT  Take 0.5 mg by nebulization 4 (four) times daily.     multivitamin with minerals Tabs tablet  Take 1 tablet by mouth daily.     predniSONE 10 MG tablet  Commonly known as:  DELTASONE  Take 1 tablet (10 mg total) by mouth daily with breakfast. For 2 days     sertraline 100 MG tablet  Commonly known as:  ZOLOFT  Take 1 tablet (100 mg total) by mouth daily.       Allergies  Allergen Reactions  . Neomycin-Bacitracin Zn-Polymyx Rash       Follow-up Information   Follow up with Michele Mcalpine, MD.   Specialty:  Pulmonary Disease   Contact information:   [address] [place] [address] [phone]        The results of significant diagnostics from this hospitalization (including imaging, microbiology, ancillary and laboratory) are listed below for reference.    Significant Diagnostic Studies: Dg Chest 2 View  12/11/2012   CLINICAL DATA:  Multiple falls.  EXAM: CHEST  2 VIEW  COMPARISON:  Chest radiograph Jun 25, 2012.  FINDINGS: Cardiac silhouette is upper limits of normal, moderately calcified aorta. Similar chronic interstitial changes, with minimal probable left posterior costophrenic angle airspace opacity with trace pleural effusions. No pneumothorax. Biapical pleural parenchymal scarring.  Again noted is left posterior 3rd and 4th rib fractures, more chronic appearing 7th and 8th rib fractures on the left. Remote distal left clavicle fracture, remote proximal left humerus fracture. Mild age indeterminate approximately T9 compression fracture. Soft tissue planes are nonsuspicious.  IMPRESSION: Chronic interstitial changes/emphysema with mild left lower lobe airspace opacity and small left pleural effusion concerning for pneumonia. Recommend followup chest radiograph after treatment to verify improvement.   Electronically Signed   By: Awilda Metro   On: 12/11/2012 03:36   Ct Head Wo Contrast  12/11/2012   CLINICAL DATA:  Partially paralyzed, recent fall. No  definite head trauma.  EXAM: CT HEAD WITHOUT CONTRAST  CT CERVICAL SPINE WITHOUT CONTRAST  TECHNIQUE: Multidetector CT imaging of the head and cervical spine was performed following the standard protocol without intravenous contrast. Multiplanar CT image reconstructions of the cervical spine were also generated.  COMPARISON:  CT of the head May 16, 2011.  FINDINGS: CT HEAD FINDINGS  No intraparenchymal hemorrhage, mass effect or midline shift. No acute large vascular territory infarct.  Status post bilateral craniectomies with dural flaps in place, which result in some streak artifact. Moderate ventriculomegaly, likely on the basis of parenchymal brain volume loss, with right parietal encephalomalacia and porencephaly. Bilateral inferior frontal lobe encephalomalacia, left posterior frontal lobe encephalomalacia is similar, the right temporal lobe encephalomalacia seen previously is less apparent due to a aforementioned artifact. 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  No abnormal extra-axial fluid collections. Basal cisterns are patent. Dense calcific atherosclerosis of the carotid siphons.  Status post left ocular lens implant, the ocular globes and orbital contents are nonsuspicious. Trace paranasal sinus  mucosal thickening, without air-fluid levels. Small left frontal mucosal retention cyst. Mastoid air cells are well aerated. Patient is edentulous.  CT CERVICAL SPINE FINDINGS  Mild motion degraded examination.  Cervical vertebral bodies and posterior elements appear intact and aligned with straightened cervical lordosis. Severe C5-6 and C6-7 degenerative disc disease with ventral spurring, moderate at C3-4 and moderate to severe at C7-T1.  Bone mineral density is decreased without destructive bony lesions. C1-2 articulation maintained. Mild pannus about the odontoid process can be seen with CPPD. Moderate calcific atherosclerosis of the left  greater than right carotid bulbs.  Included view of the lungs demonstrates biapical pleural parenchymal scarring, moderate to severe centrilobular and paraseptal emphysema.  Degenerative disc disease and facet arthropathy result in moderate C5-6 and C6-7 canal stenosis, severe neural foraminal narrowing at these 2 levels.  IMPRESSION: CT Head: No acute intracranial process.  Status post bilateral cranioplasty's, with remote posttraumatic change.  CT cervical spine: Straightened cervical lordosis without fracture or malalignment.  Degenerative change of the cervical spine results in moderate C5-6 and C6-7 canal stenosis, severe neural foraminal narrowing at these 2 levels.  moderate C5-6 and C6-7 canal stenosis, severe neural foraminal narrowing at these 2 levels.  moderate C5-6 and C6-7 canal stenosis, severe neural foraminal narrowing at these 2 levels.   Electronically Signed   By: Awilda Metro   On: 12/11/2012 03:56   Ct Cervical Spine Wo Contrast  12/11/2012   CLINICAL DATA:  Partially paralyzed, recent fall. No definite head trauma.  EXAM: CT HEAD WITHOUT CONTRAST  CT CERVICAL SPINE WITHOUT CONTRAST  TECHNIQUE: Multidetector CT imaging of the head and cervical spine was performed following the standard protocol without intravenous contrast. Multiplanar CT image reconstructions of the cervical spine were also generated.  COMPARISON:  CT of the head May 16, 2011.  FINDINGS: CT HEAD FINDINGS  No intraparenchymal hemorrhage, mass effect or midline shift. No acute large vascular territory infarct.  Status post bilateral craniectomies with dural flaps in place, which result in some streak artifact. Moderate ventriculomegaly, likely on the basis of parenchymal brain volume loss, with right parietal encephalomalacia and porencephaly. Bilateral inferior frontal lobe encephalomalacia, left posterior frontal lobe encephalomalacia is similar, the right temporal lobe encephalomalacia seen previously is less  apparent due to a aforementioned artifact. 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  No abnormal extra-axial fluid collections. Basal cisterns are patent. Dense calcific atherosclerosis of the carotid siphons.  Status post left ocular lens implant, the ocular globes and orbital contents are nonsuspicious. Trace paranasal sinus mucosal thickening, without air-fluid levels. Small left frontal mucosal retention cyst. Mastoid air cells are well aerated. Patient is edentulous.  CT CERVICAL SPINE FINDINGS  Mild motion degraded examination.  Cervical vertebral bodies and posterior elements appear intact and aligned with straightened cervical lordosis. Severe C5-6 and C6-7 degenerative disc disease with ventral spurring, moderate at C3-4 and moderate to severe at C7-T1.  Bone mineral density is decreased without destructive bony lesions. C1-2 articulation maintained. Mild pannus about the odontoid process can be seen with CPPD. Moderate calcific atherosclerosis of the left greater than right carotid bulbs.  Included view of the lungs demonstrates biapical pleural parenchymal scarring, moderate to severe centrilobular and paraseptal emphysema.  Degenerative disc disease and facet arthropathy result in moderate C5-6 and C6-7 canal stenosis, severe neural foraminal narrowing at these 2 levels.  IMPRESSION: CT Head: No acute intracranial process.  Status post bilateral cranioplasty's, with remote posttraumatic change.  CT cervical spine: Straightened cervical lordosis without fracture or malalignment.  Degenerative change of the cervical spine results in moderate C5-6 and C6-7 canal stenosis, severe neural foraminal narrowing at these 2 levels.  moderate C5-6 and C6-7 canal stenosis, severe neural foraminal narrowing at these 2 levels.  moderate C5-6 and C6-7 canal stenosis, severe neural foraminal narrowing at these 2 levels.   Electronically Signed   By: Awilda Metro   On: 12/11/2012 03:56   Mr Brain Wo Contrast  12/11/2012   CLINICAL DATA:  Stroke. Frequent falls over the last 2 days. Brief episode of slurred speech.  EXAM: MRI HEAD WITHOUT CONTRAST  TECHNIQUE: Multiplanar, multiecho pulse sequences of the brain and surrounding structures were obtained without intravenous contrast.  COMPARISON:  CT head without contrast from the same day. CT head without contrast 07/13/2006 at [facility].  FINDINGS: The study is moderately degraded by patient motion.  The patient is status post bilateral craniotomies for previous trauma. Expected dilation of the right lateral ventricle is again noted.  The diffusion-weighted imaging demonstrates no evidence for acute or subacute infarction. In addition to the atrophy there is moderate periventricular white matter disease. A remote left frontal lobe infarct is evident.  Flow is present in the major intracranial arteries. The patient is status post left lens extraction. Mild mucosal thickening is noted in the posterior right ethmoid air cells. There is some fluid in the right mastoid air cells.  IMPRESSION: 1. No acute intracranial abnormality or focal lesion to explain the patient's symptoms. 2. Remote encephalomalacia and stable ex vacuo dilation of the lateral ventricles, right greater than left. 3. Remote left frontal lobe infarct. 4. Status post bilateral craniotomies.   Electronically Signed   By: Gennette Pac M.D.   On: 12/11/2012 17:48    Microbiology: No results found for this or any previous visit (from the past 240 hour(s)).   Labs: Basic Metabolic Panel:  Recent Labs Lab 12/10/12 2244 12/11/12 0845 12/12/12 0543 12/14/12 0410  NA 138 137 136 135  K 4.0 3.5 3.5 3.7  CL 95* 95* 97 99  CO2 29 25 25 25   GLUCOSE 108* 119* 113* 88  BUN 29* 28* 26* 23  CREATININE 1.20 1.08 1.15 1.11  CALCIUM 9.6 9.3 8.7 8.9   Liver Function Tests:  Recent Labs Lab 12/10/12 2244 12/12/12 0543 12/14/12 0410   AST 59* 71* 41*  ALT 20 24 29   ALKPHOS 35* 28* 27*  BILITOT 0.5 0.4 0.3  PROT 8.2 6.7 6.5  ALBUMIN 3.9 2.9* 2.8*   No results found for this basename: LIPASE, AMYLASE,  in the last 168 hours  Recent Labs Lab 12/11/12 0300  AMMONIA 22   CBC:  Recent Labs Lab 12/10/12 2244 12/11/12 0845 12/12/12 0543  WBC 11.4* 7.8 10.2  NEUTROABS 9.4* 7.3  --   HGB 13.1 11.1* 10.2*  HCT 38.0* 33.4* 29.5*  MCV 85.4 86.3 85.0  PLT 191 139* 136*   Cardiac Enzymes:  Recent Labs Lab 12/14/12 0410  CKTOTAL 317*   BNP: BNP (last 3 results) No results found for this basename: PROBNP,  in the last 8760 hours CBG: No results found for this basename: GLUCAP,  in the last 168 hours  Signed:  Heliodoro Domagalski L  [facility] 12/16/2012, 11:03 AM

## 2012-12-16 NOTE — Progress Notes (Signed)
Occupational Therapy Treatment Patient Details Name: Antonio Marshall MRN: [id number] DOB: 02-24-1940 Today's Date: 12/16/2012 Time: 3244-0102 OT Time Calculation (min): 26 min  OT Assessment / Plan / Recommendation  History of present illness Antonio Marshall is a 72 y.o. male with known history of COPD and ongoing tobacco abuse, history of head trauma, hyperlipidemia on diet and previous history of alcohol abuse quit one year ago was brought to the ER after patient has been having frequent falls over the last 2 days. As per the patient's daughter with whom I spoke patient has been having frequent falls and yesterday in the morning on 5 AM patient also had brief episode of slurred speech. He did not lose consciousness. Patient has mild weakness of the left lower extremity from previous trauma. In addition patient has been having some productive cough for last 3 days. In the ER patient was found to have mild wheeze and was treated with nebulizer and Solu-Medrol and started on antibiotics for pneumonia. CT head did not show anything acute and C-spine showed canal stenosis and narrowing.    OT comments  Pt still at a min guard assist level for balance with functional selfcare tasks, grooming, and transfers.  Recommend 24 hour supervision for safety as pt is still a high fall risk. Plan for discharge to SNF later today for continued rehab.    Follow Up Recommendations  Supervision/Assistance - 24 hour       Equipment Recommendations  None recommended by OT       Frequency Min 2X/week   Progress towards OT Goals  progressing toward goals some goals achieved see care plan section.  Plan Discharge plan remains appropriate    Precautions / Restrictions Precautions Precautions: Fall Restrictions Weight Bearing Restrictions: No   Pertinent Vitals/Pain No report of pain during session.    ADL  Grooming: Performed;Wash/dry hands;Min guard Where Assessed - Grooming: Unsupported standing Lower  Body Bathing: Performed;Min guard Where Assessed - Lower Body Bathing: Unsupported sit to stand Lower Body Dressing: Performed;Min guard Where Assessed - Lower Body Dressing: Unsupported sit to stand Toilet Transfer: Min Pension scheme manager Method: Surveyor, minerals: Comfort height toilet;Grab bars Transfers/Ambulation Related to ADLs: Pt is able to ambulate with the RW and min guard assist.  Needs occasional min assist if ambulating without the RW. ADL Comments: Pt maintains flexed cervical posture and trunk as well as slight lateral cervical flexion to the right.  Pt attempted to urinate in the codom cath and it leaked so worked on LB washing and dressing sit to stand at the sink.  Pt overall min guard assist to perform.      Visit Information  Last OT Received On: 12/16/12 Assistance Needed: +1 History of Present Illness: Antonio Marshall is a 72 y.o. male with known history of COPD and ongoing tobacco abuse, history of head trauma, hyperlipidemia on diet and previous history of alcohol abuse quit one year ago was brought to the ER after patient has been having frequent falls over the last 2 days. As per the patient's daughter with whom I spoke patient has been having frequent falls and yesterday in the morning on 5 AM patient also had brief episode of slurred speech. He did not lose consciousness. Patient has mild weakness of the left lower extremity from previous trauma. In addition patient has been having some productive cough for last 3 days. In the ER patient was found to have mild wheeze and was treated with nebulizer  and Solu-Medrol and started on antibiotics for pneumonia. CT head did not show anything acute and C-spine showed canal stenosis and narrowing.           Cognition  Cognition Arousal/Alertness: Awake/alert Behavior During Therapy: WFL for tasks assessed/performed Overall Cognitive Status: History of cognitive impairments - at baseline (Pt able to  recall therapist's age from 4 days prior.)    Mobility  Transfers Transfers: Sit to Stand Sit to Stand: From chair/3-in-1;With upper extremity assist;4: Min guard Stand to Sit: 4: Min guard;To chair/3-in-1;With upper extremity assist Details for Transfer Assistance: Min instructional cueing to turn completely around and square up with the chair before attempting to sit down.         Balance Balance Balance Assessed: Yes Static Standing Balance Static Standing - Balance Support: No upper extremity supported Static Standing - Level of Assistance: 5: Stand by assistance Dynamic Standing Balance Dynamic Standing - Balance Support: Right upper extremity supported;Left upper extremity supported Dynamic Standing - Level of Assistance: 4: Min assist Dynamic Standing - Balance Activities: Reaching for objects   End of Session OT - End of Session Equipment Utilized During Treatment: Gait belt Activity Tolerance: Patient tolerated treatment well Patient left: in chair Nurse Communication: Mobility status     Antonio Marshall OTR/L 12/16/2012, 12:21 PM
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
## 2012-12-16 NOTE — Progress Notes (Signed)
Went over discharge instructions with patient. Information also sent over to facility. IV was d/c'd and patient taken off the monitor. Patient stable. EMS transporting patient for discharge. Stanton Kidney R

## 2012-12-16 NOTE — Progress Notes (Signed)
Report called and given to [facility], at [facility] facility. Antonio Marshall, Antonio Marshall

## 2012-12-16 NOTE — Progress Notes (Signed)
Clinical Social Worker facilitated patient discharge by contacting the patient, family and facility, [facility]. Patient agreeable to this plan and arranging transport via EMS . CSW will sign off, as social work intervention is no longer needed.  Kosisochukwu Burningham, MSW, LCSWA [phone]  

## 2012-12-17 LAB — FOLATE RBC: RBC Folate: 1013 ng/mL — ABNORMAL HIGH (ref >280)

## 2012-12-20 LAB — VITAMIN B1: Vitamin B1 (Thiamine): 19 nmol/L (ref 8–30)

## 2012-12-25 ENCOUNTER — Ambulatory Visit: Payer: Medicare Other | Admitting: Pulmonary Disease

## 2012-12-29 ENCOUNTER — Other Ambulatory Visit: Payer: Self-pay | Admitting: Pulmonary Disease

## 2012-12-30 ENCOUNTER — Telehealth: Payer: Self-pay | Admitting: Pulmonary Disease

## 2012-12-30 NOTE — Telephone Encounter (Signed)
I spoke with daughter. She reports pt needs a refill on norco 5-325. Pt last received RX 12/03/12. Please advise SN thanks

## 2012-12-31 NOTE — Telephone Encounter (Signed)
Per SN---  Not able to refill until 12/4.  Then ok to give #90. thanks

## 2012-12-31 NOTE — Telephone Encounter (Signed)
Antonio Marshall is aware and will call back on 01/02/13. Nothing further needed

## 2013-01-03 ENCOUNTER — Other Ambulatory Visit: Payer: Self-pay | Admitting: Pulmonary Disease

## 2013-01-03 MED ORDER — HYDROCODONE-ACETAMINOPHEN 5-325 MG PO TABS: ORAL_TABLET | ORAL | Status: DC

## 2013-01-03 NOTE — Telephone Encounter (Signed)
rx printed and signed. Given to Pura Spice. Carron Curie, CMA

## 2013-01-13 ENCOUNTER — Telehealth: Payer: Self-pay | Admitting: Pulmonary Disease

## 2013-01-13 NOTE — Telephone Encounter (Signed)
I spoke with Cordelia Pen. She is checking on status of face to face form sent to [place]. She confirmed she sent this to [phone]. Please advise Marliss Czar if this was received thanks

## 2013-01-14 ENCOUNTER — Ambulatory Visit (INDEPENDENT_AMBULATORY_CARE_PROVIDER_SITE_OTHER): Payer: Medicare Other | Admitting: Adult Health

## 2013-01-14 ENCOUNTER — Ambulatory Visit (INDEPENDENT_AMBULATORY_CARE_PROVIDER_SITE_OTHER)
Admission: RE | Admit: 2013-01-14 | Discharge: 2013-01-14 | Disposition: A | Discharge status: Home / Self Care | Payer: Medicare Other | Source: Ambulatory Visit | Attending: Adult Health | Admitting: Adult Health

## 2013-01-14 ENCOUNTER — Encounter: Payer: Self-pay | Admitting: Adult Health

## 2013-01-14 VITALS — BP 122/62 | HR 70 | Temp 98.0°F | Ht 70.0 in | Wt 134.8 lb

## 2013-01-14 DIAGNOSIS — J189 Pneumonia, unspecified organism: Secondary | ICD-10-CM

## 2013-01-14 DIAGNOSIS — F411 Generalized anxiety disorder: Secondary | ICD-10-CM

## 2013-01-14 DIAGNOSIS — R269 Unspecified abnormalities of gait and mobility: Secondary | ICD-10-CM

## 2013-01-14 DIAGNOSIS — J449 Chronic obstructive pulmonary disease, unspecified: Secondary | ICD-10-CM

## 2013-01-14 MED ORDER — SERTRALINE HCL 100 MG PO TABS: 100.0000 mg | ORAL_TABLET | Freq: Every day | ORAL | Status: AC

## 2013-01-14 MED ORDER — METHYLPREDNISOLONE 4 MG PO TABS: ORAL_TABLET | ORAL | Status: DC

## 2013-01-14 NOTE — Patient Instructions (Signed)
Restart Medrol 4mg  1/2 daily  Restart Zoloft 100mg  daily  Chest xray today  Follow up Dr. Kriste Basque  In 4-6 weeks and As needed   Please contact office for sooner follow up if symptoms do not improve or worsen or seek emergency care
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
## 2013-01-14 NOTE — Assessment & Plan Note (Signed)
Chronic gait abnormality. B12, and folate were OK. C-spine showed canal stenosis, and narrowing We'll evaluate on return may consider MRI of the C-spine. Patient was unable to get MRI during his hospitalization. May need referral to neurology.

## 2013-01-14 NOTE — Assessment & Plan Note (Signed)
Reason exacerbation, with left lower lobe pneumonia. Pneumonia. Has cleared on x-ray. Patient continues on smoking cessation. He'll continue on his current regimen. Patient has been on steroids chronically. We'll restart Medrol at 4 mg one half tablet daily. If improved on return as he is not smoking. May consider a slow taper of Medrol. Patient will follow back up with Dr. Kriste Basque in one month and as needed

## 2013-01-14 NOTE — Assessment & Plan Note (Signed)
Anxiety we started on Zoloft. Patient encouraged on stress reducers. Patient has a history of alcohol abuse. Over the Xanax if possible

## 2013-01-15 ENCOUNTER — Telehealth: Payer: Self-pay | Admitting: Pulmonary Disease

## 2013-01-15 NOTE — Telephone Encounter (Signed)
lmomtcb for calista from [facility].

## 2013-01-15 NOTE — Telephone Encounter (Signed)
Calista from [place] is returning triage's call.  Antionette Fairy

## 2013-01-15 NOTE — Telephone Encounter (Signed)
Spoke with Antonio Marshall and she has the form that [facility] is calling about.  Will place on SN cart to be signed and will fax back once completed.

## 2013-01-15 NOTE — Telephone Encounter (Signed)
Notes Recorded by Julio Sicks, NP on 01/15/2013 at 10:02 AM No sign of PNA  Cont on current regimen  Chronic changes noted , may need CT chest , consider on return ov  Great job on not smoking ---  Daughter aware of recs. I also calista and she was just calling to let us knowpt slid off the couch but had no injuries. He is fine. Nothing further needed

## 2013-01-21 ENCOUNTER — Telehealth: Payer: Self-pay | Admitting: Pulmonary Disease

## 2013-01-21 NOTE — Telephone Encounter (Signed)
I spoke with Antonio Marshall. She reports she saw pt today. He is very fragile and frail. He is currently staying with daughter [name] and she feels overwhelmed with everything bc she takes care of other people as well. Pt is wanting to go back to [facility] and daughter agree's as well. Antonio Marshall called [facility] and stated for [place] to send FL2 form over and they will review this. Please advise SN thanks

## 2013-01-22 ENCOUNTER — Telehealth: Payer: Self-pay | Admitting: Pulmonary Disease

## 2013-01-22 NOTE — Telephone Encounter (Signed)
FL2 has been completed and i have tried to get in touch with becky from [facility].  Will try to call her back on Friday to find out more info on the FL2 that needed to be done.

## 2013-01-22 NOTE — Telephone Encounter (Signed)
Called, spoke with Chandni with [facility].  She visited with pt yesterday.  States he declined PT today d/t [place].  Chandni reports she will see pt again next week.  This is FYI.  Will sign off and sent to SN as FYI only.

## 2013-01-24 NOTE — Telephone Encounter (Signed)
Called and spoke with becky from [facility] and she stated that the pt is not able to left alone at all.  She stated that he would qualify for skilled nursing and requested that the copy of the FL2 be faxed to [facility].  This has been done and nothing further is needed. [facility] admissions dept fax #  [phone]

## 2013-01-24 NOTE — Telephone Encounter (Signed)
Antonio Marshall returned call & can be reached at  [phone].  Antonio Marshall

## 2013-01-24 NOTE — Telephone Encounter (Signed)
I called Becky and LMTCB x1 Leigh has form. We need to know what is the recommend: home/SNF/ICF/HOPSITAL/DOMICILIARY (REST HOME)/OTHER. Not sure which should be checked

## 2013-01-29 ENCOUNTER — Telehealth: Payer: Self-pay | Admitting: Pulmonary Disease

## 2013-01-29 NOTE — Telephone Encounter (Signed)
Original form placed in envelope up front to be picked up. Left detailed msg on vmail for Becky to come pick up. Aware via msg that we close at 1pm

## 2013-02-13 ENCOUNTER — Telehealth: Payer: Self-pay | Admitting: Pulmonary Disease

## 2013-02-13 ENCOUNTER — Emergency Department (HOSPITAL_COMMUNITY): Payer: Medicare Other

## 2013-02-13 ENCOUNTER — Inpatient Hospital Stay (HOSPITAL_COMMUNITY)
Admission: EM | Admit: 2013-02-13 | Discharge: 2013-02-20 | DRG: 480 | Disposition: A | Discharge status: Skilled Nursing Facility | Payer: Medicare Other | Attending: Family Medicine | Admitting: Family Medicine

## 2013-02-13 ENCOUNTER — Encounter (HOSPITAL_COMMUNITY): Payer: Self-pay | Admitting: Emergency Medicine

## 2013-02-13 DIAGNOSIS — Z803 Family history of malignant neoplasm of breast: Secondary | ICD-10-CM

## 2013-02-13 DIAGNOSIS — S72002A Fracture of unspecified part of neck of left femur, initial encounter for closed fracture: Secondary | ICD-10-CM

## 2013-02-13 DIAGNOSIS — Z833 Family history of diabetes mellitus: Secondary | ICD-10-CM

## 2013-02-13 DIAGNOSIS — S0990XA Unspecified injury of head, initial encounter: Secondary | ICD-10-CM

## 2013-02-13 DIAGNOSIS — J4489 Other specified chronic obstructive pulmonary disease: Secondary | ICD-10-CM

## 2013-02-13 DIAGNOSIS — S72143A Displaced intertrochanteric fracture of unspecified femur, initial encounter for closed fracture: Principal | ICD-10-CM

## 2013-02-13 DIAGNOSIS — Z23 Encounter for immunization: Secondary | ICD-10-CM

## 2013-02-13 DIAGNOSIS — W19XXXA Unspecified fall, initial encounter: Secondary | ICD-10-CM | POA: Diagnosis present

## 2013-02-13 DIAGNOSIS — Z889 Allergy status to unspecified drugs, medicaments and biological substances status: Secondary | ICD-10-CM

## 2013-02-13 DIAGNOSIS — R269 Unspecified abnormalities of gait and mobility: Secondary | ICD-10-CM

## 2013-02-13 DIAGNOSIS — Z825 Family history of asthma and other chronic lower respiratory diseases: Secondary | ICD-10-CM

## 2013-02-13 DIAGNOSIS — Z9181 History of falling: Secondary | ICD-10-CM

## 2013-02-13 DIAGNOSIS — D649 Anemia, unspecified: Secondary | ICD-10-CM

## 2013-02-13 DIAGNOSIS — F1010 Alcohol abuse, uncomplicated: Secondary | ICD-10-CM

## 2013-02-13 DIAGNOSIS — Z8249 Family history of ischemic heart disease and other diseases of the circulatory system: Secondary | ICD-10-CM

## 2013-02-13 DIAGNOSIS — N179 Acute kidney failure, unspecified: Secondary | ICD-10-CM

## 2013-02-13 DIAGNOSIS — J449 Chronic obstructive pulmonary disease, unspecified: Secondary | ICD-10-CM | POA: Diagnosis present

## 2013-02-13 DIAGNOSIS — J189 Pneumonia, unspecified organism: Secondary | ICD-10-CM

## 2013-02-13 DIAGNOSIS — I739 Peripheral vascular disease, unspecified: Secondary | ICD-10-CM | POA: Diagnosis present

## 2013-02-13 DIAGNOSIS — D72829 Elevated white blood cell count, unspecified: Secondary | ICD-10-CM

## 2013-02-13 DIAGNOSIS — F1020 Alcohol dependence, uncomplicated: Secondary | ICD-10-CM | POA: Diagnosis present

## 2013-02-13 DIAGNOSIS — I2510 Atherosclerotic heart disease of native coronary artery without angina pectoris: Secondary | ICD-10-CM | POA: Diagnosis present

## 2013-02-13 DIAGNOSIS — G589 Mononeuropathy, unspecified: Secondary | ICD-10-CM | POA: Diagnosis present

## 2013-02-13 DIAGNOSIS — F411 Generalized anxiety disorder: Secondary | ICD-10-CM

## 2013-02-13 DIAGNOSIS — J8410 Pulmonary fibrosis, unspecified: Secondary | ICD-10-CM | POA: Diagnosis present

## 2013-02-13 DIAGNOSIS — Z79899 Other long term (current) drug therapy: Secondary | ICD-10-CM

## 2013-02-13 DIAGNOSIS — E7800 Pure hypercholesterolemia, unspecified: Secondary | ICD-10-CM

## 2013-02-13 DIAGNOSIS — S72009A Fracture of unspecified part of neck of unspecified femur, initial encounter for closed fracture: Secondary | ICD-10-CM

## 2013-02-13 DIAGNOSIS — F17200 Nicotine dependence, unspecified, uncomplicated: Secondary | ICD-10-CM

## 2013-02-13 DIAGNOSIS — R296 Repeated falls: Secondary | ICD-10-CM

## 2013-02-13 DIAGNOSIS — Z66 Do not resuscitate: Secondary | ICD-10-CM | POA: Diagnosis present

## 2013-02-13 DIAGNOSIS — IMO0002 Reserved for concepts with insufficient information to code with codable children: Secondary | ICD-10-CM

## 2013-02-13 DIAGNOSIS — F0390 Unspecified dementia without behavioral disturbance: Secondary | ICD-10-CM | POA: Diagnosis present

## 2013-02-13 DIAGNOSIS — A0472 Enterocolitis due to Clostridium difficile, not specified as recurrent: Secondary | ICD-10-CM | POA: Diagnosis present

## 2013-02-13 LAB — TYPE AND SCREEN
ABO/RH(D): B POS
Antibody Screen: NEGATIVE

## 2013-02-13 LAB — ABO/RH: ABO/RH(D): B POS

## 2013-02-13 LAB — CBC WITH DIFFERENTIAL/PLATELET
BASOS ABS: 0 10*3/uL (ref 0.0–0.1)
Basophils Relative: 0 % (ref 0–1)
EOS ABS: 0 10*3/uL (ref 0.0–0.7)
Eosinophils Relative: 0 % (ref 0–5)
HCT: 29.5 % — ABNORMAL LOW (ref 39.0–52.0)
HEMOGLOBIN: 10.1 g/dL — AB (ref 13.0–17.0)
LYMPHS PCT: 12 % (ref 12–46)
Lymphs Abs: 1.4 10*3/uL (ref 0.7–4.0)
MCH: 28.6 pg (ref 26.0–34.0)
MCHC: 34.2 g/dL (ref 30.0–36.0)
MCV: 83.6 fL (ref 78.0–100.0)
MONO ABS: 1.1 10*3/uL — AB (ref 0.1–1.0)
Monocytes Relative: 9 % (ref 3–12)
NEUTROS PCT: 79 % — AB (ref 43–77)
Neutro Abs: 9.3 10*3/uL — ABNORMAL HIGH (ref 1.7–7.7)
PLATELETS: 302 10*3/uL (ref 150–400)
RBC: 3.53 MIL/uL — ABNORMAL LOW (ref 4.22–5.81)
RDW: 14.5 % (ref 11.5–15.5)
WBC: 11.8 10*3/uL — ABNORMAL HIGH (ref 4.0–10.5)

## 2013-02-13 LAB — PROTIME-INR
INR: 1.29 (ref 0.00–1.49)
PROTHROMBIN TIME: 15.8 s — AB (ref 11.6–15.2)

## 2013-02-13 LAB — BASIC METABOLIC PANEL
BUN: 61 mg/dL — AB (ref 6–23)
CALCIUM: 9.3 mg/dL (ref 8.4–10.5)
CO2: 20 mEq/L (ref 19–32)
Chloride: 103 mEq/L (ref 96–112)
Creatinine, Ser: 1.45 mg/dL — ABNORMAL HIGH (ref 0.50–1.35)
GFR, EST AFRICAN AMERICAN: 54 mL/min — AB (ref >90)
GFR, EST NON AFRICAN AMERICAN: 46 mL/min — AB (ref >90)
GLUCOSE: 102 mg/dL — AB (ref 70–99)
Potassium: 5 mEq/L (ref 3.7–5.3)
Sodium: 142 mEq/L (ref 137–147)

## 2013-02-13 MED ORDER — MORPHINE SULFATE 2 MG/ML IJ SOLN: 0.5000 mg | INTRAMUSCULAR | Status: DC | PRN

## 2013-02-13 MED ORDER — HEPARIN SODIUM (PORCINE) 5000 UNIT/ML IJ SOLN
5000.0000 [IU] | Freq: Three times a day (TID) | INTRAMUSCULAR | Status: DC
  Administered 2013-02-13 – 2013-02-14 (×2): 5000 [IU] via SUBCUTANEOUS
  Filled 2013-02-13 (×8): qty 1

## 2013-02-13 MED ORDER — FOLIC ACID 1 MG PO TABS
1.0000 mg | ORAL_TABLET | Freq: Every day | ORAL | Status: DC
  Administered 2013-02-13 – 2013-02-20 (×8): 1 mg via ORAL
  Filled 2013-02-13 (×8): qty 1

## 2013-02-13 MED ORDER — MORPHINE SULFATE 4 MG/ML IJ SOLN
4.0000 mg | Freq: Once | INTRAMUSCULAR | Status: AC
  Administered 2013-02-13: 4 mg via INTRAVENOUS
  Filled 2013-02-13: qty 1

## 2013-02-13 MED ORDER — SENNOSIDES-DOCUSATE SODIUM 8.6-50 MG PO TABS: 1.0000 | ORAL_TABLET | Freq: Every evening | ORAL | Status: DC | PRN

## 2013-02-13 MED ORDER — ADULT MULTIVITAMIN W/MINERALS CH
1.0000 | ORAL_TABLET | Freq: Every day | ORAL | Status: DC
  Administered 2013-02-13 – 2013-02-20 (×8): 1 via ORAL
  Filled 2013-02-13 (×8): qty 1

## 2013-02-13 MED ORDER — SODIUM CHLORIDE 0.9 % IV SOLN
INTRAVENOUS | Status: DC
  Administered 2013-02-13 – 2013-02-17 (×2): via INTRAVENOUS

## 2013-02-13 MED ORDER — METHYLPREDNISOLONE 2 MG PO TABS
2.0000 mg | ORAL_TABLET | Freq: Every day | ORAL | Status: DC
  Administered 2013-02-13 – 2013-02-19 (×7): 2 mg via ORAL
  Filled 2013-02-13 (×8): qty 1

## 2013-02-13 MED ORDER — VITAMIN B-1 100 MG PO TABS
100.0000 mg | ORAL_TABLET | Freq: Every day | ORAL | Status: DC
  Administered 2013-02-13 – 2013-02-20 (×8): 100 mg via ORAL
  Filled 2013-02-13 (×8): qty 1

## 2013-02-13 MED ORDER — METHOCARBAMOL 100 MG/ML IJ SOLN
500.0000 mg | Freq: Four times a day (QID) | INTRAMUSCULAR | Status: DC | PRN
  Filled 2013-02-13: qty 5

## 2013-02-13 MED ORDER — METHOCARBAMOL 500 MG PO TABS
500.0000 mg | ORAL_TABLET | Freq: Four times a day (QID) | ORAL | Status: DC | PRN
  Administered 2013-02-14 – 2013-02-18 (×8): 500 mg via ORAL
  Filled 2013-02-13 (×8): qty 1

## 2013-02-13 MED ORDER — HYDROCODONE-ACETAMINOPHEN 5-325 MG PO TABS
1.0000 | ORAL_TABLET | Freq: Four times a day (QID) | ORAL | Status: DC | PRN
  Administered 2013-02-13: 2 via ORAL
  Administered 2013-02-14: 1 via ORAL
  Administered 2013-02-14 – 2013-02-15 (×2): 2 via ORAL
  Administered 2013-02-15 – 2013-02-17 (×3): 1 via ORAL
  Administered 2013-02-17 – 2013-02-18 (×2): 2 via ORAL
  Administered 2013-02-19: 1 via ORAL
  Filled 2013-02-13: qty 2
  Filled 2013-02-13 (×2): qty 1
  Filled 2013-02-13: qty 2
  Filled 2013-02-13 (×2): qty 1
  Filled 2013-02-13 (×3): qty 2
  Filled 2013-02-13: qty 1

## 2013-02-13 MED ORDER — SODIUM CHLORIDE 0.9 % IV SOLN
1000.0000 mL | INTRAVENOUS | Status: DC
  Administered 2013-02-13: 1000 mL via INTRAVENOUS

## 2013-02-13 MED ORDER — SERTRALINE HCL 100 MG PO TABS
100.0000 mg | ORAL_TABLET | Freq: Every day | ORAL | Status: DC
  Administered 2013-02-13 – 2013-02-20 (×8): 100 mg via ORAL
  Filled 2013-02-13 (×8): qty 1

## 2013-02-13 MED ORDER — MOMETASONE FURO-FORMOTEROL FUM 100-5 MCG/ACT IN AERO
2.0000 | INHALATION_SPRAY | Freq: Two times a day (BID) | RESPIRATORY_TRACT | Status: DC
  Administered 2013-02-13 – 2013-02-20 (×14): 2 via RESPIRATORY_TRACT
  Filled 2013-02-13: qty 8.8

## 2013-02-13 MED ORDER — ALBUTEROL SULFATE (2.5 MG/3ML) 0.083% IN NEBU
2.5000 mg | INHALATION_SOLUTION | RESPIRATORY_TRACT | Status: DC | PRN
  Administered 2013-02-13 – 2013-02-19 (×4): 2.5 mg via RESPIRATORY_TRACT
  Filled 2013-02-13 (×4): qty 3

## 2013-02-13 MED ORDER — MORPHINE SULFATE 4 MG/ML IJ SOLN: 4.0000 mg | Freq: Once | INTRAMUSCULAR | Status: DC

## 2013-02-13 MED ORDER — ALBUTEROL SULFATE (2.5 MG/3ML) 0.083% IN NEBU
5.0000 mg | INHALATION_SOLUTION | Freq: Once | RESPIRATORY_TRACT | Status: AC
  Administered 2013-02-13: 5 mg via RESPIRATORY_TRACT
  Filled 2013-02-13: qty 6

## 2013-02-13 NOTE — ED Notes (Signed)
Pt getting ready to transfer within hospital

## 2013-02-13 NOTE — Telephone Encounter (Signed)
Called and spoke with donna and she stated that the pt is up on the floor now and they are speaking with the medical doctor. Nothing further is needed.

## 2013-02-13 NOTE — ED Notes (Signed)
Pt from [facility], presents with confirmed left hip fracture and pneumonia. Pt was found this Am w/ skin tear to left arm and shortening w/ rotation of left leg. Xray at facility confirmed hip fracture. Chest xray at facility confirmed pneumonia per EMS. Hx of dementia, At baseline Neuro status

## 2013-02-13 NOTE — ED Notes (Signed)
Patient transported to X-ray 

## 2013-02-13 NOTE — Telephone Encounter (Signed)
Per SN---  The er doctor will eval the pt  [facility] will admit the pt and give the surgical clearance that is needed to the surgeon.   Attempted to call donna but was disconnected in the middle of the conversation.  Will try back

## 2013-02-13 NOTE — Telephone Encounter (Signed)
Contact daughter [name](Donna)-- is at ED with father Lupita LeashDonna has limited cell phone service and we lost connection. Lupita LeashDonna stated that father is in ED with a broken hip--phone got D/C ------------------------ ED phys states that he needs surgery--needs sx clearance from SN Aware that SN out of office--leigh is going to get in touch and will contact Lupita LeashDonna back   Will forward to leigh to handle.

## 2013-02-13 NOTE — H&P (Signed)
[facility]          History and Physical    PCP:   NADEL,SCOTT M, MD   Chief Complaint:  Left hip pain following multiple falls this week  HPI: Patient is a 73 year old man with history of chronic alcohol abuse, chronic tobacco abuse, COPD/interstitial lung disease followed by Dr. Lenna Gilford. It appears that ever since Thanksgiving he has been having multiple falls. He was subsequently admitted to [facility] on December 31 as his family was having difficulty caring for him at home. Over this past week patient's family states that he has fallen about 4-5 times. Yesterday he suffered a fall after which he complained of immediate left hip pain. An x-ray of the hip was performed that was reported to the family as being negative. Subsequently this morning they were called and were told that the x-ray was indeed positive for an acute comminuted intertrochanteric fracture of the left hip and that they were going to send him to the hospital for evaluation. Patient is cognitively impaired and as such history is provided by the patient's family who includes his sister, his daughter and 2 granddaughters at bedside. Patient is a DO NOT RESUSCITATE. Dr. Percell Miller has been consulted to provide orthopedic recommendations. Hospitalist admission was requested.  Allergies:   Allergies  Allergen Reactions  . Neomycin-Bacitracin Zn-Polymyx Rash      Past Medical History  Diagnosis Date  . Bilateral senile cataracts   . Macular degeneration, bilateral   . COPD (chronic obstructive pulmonary disease)   . Cigarette smoker   . CAD (coronary artery disease)     ?  Marland Kitchen Peripheral vascular disease   . Alcohol abuse   . Head trauma     closed  . Neuropathy   . Abnormality of gait   . Anxiety   . Shortness of breath     Past Surgical History  Procedure Laterality Date  . Appendectomy    . 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 Authorizing Provider  acetaminophen (TYLENOL) 325 MG tablet Take 2 tablets (650 mg total) by mouth every 4 (four) hours as needed for mild pain, moderate pain or headache. 12/16/12  Yes Delfina Redwood, MD  albuterol (ACCUNEB) 0.63 MG/3ML nebulizer solution Take 1 ampule by nebulization 4 (four) times daily. 12/16/12  Yes Delfina Redwood, MD  dextromethorphan-guaiFENesin (ROBITUSSIN-DM) 10-100 MG/5ML liquid Take 15 mLs by mouth 4 (four) times daily.   Yes Historical Provider, MD  Fluticasone-Salmeterol (ADVAIR) 250-50 MCG/DOSE AEPB Inhale 1 puff into the lungs 2 (two) times daily.   Yes Historical Provider, MD  guaiFENesin (MUCINEX) 600 MG 12 hr tablet Take 600 mg by mouth 2 (two) times daily as needed for cough.    Yes Historical Provider, MD  HYDROcodone-acetaminophen (NORCO/VICODIN) 5-325 MG per tablet Take 1 tablet by mouth 3 (three) times daily.   Yes Historical Provider, MD  ipratropium (ATROVENT) 0.02 % nebulizer solution Take 0.5 mg by nebulization 4 (four) times daily.   Yes Historical Provider, MD  levofloxacin (LEVAQUIN) 500 MG tablet Take 500 mg by mouth daily.   Yes Historical Provider, MD  methylPREDNISolone (MEDROL) 4 MG tablet Take 2 mg by mouth daily. 01/14/13  Yes Tammy S Parrett, NP  Multiple Vitamin (MULTIVITAMIN WITH MINERALS) TABS tablet Take 1 tablet by mouth daily.   Yes Historical Provider, MD  predniSONE (DELTASONE) 10 MG tablet Take 1  tablet (10 mg total) by mouth daily with breakfast. For 2 days 12/16/12  Yes Delfina Redwood, MD  sertraline (ZOLOFT) 100 MG tablet Take 1 tablet (100 mg total) by mouth daily. 01/14/13  Yes Tammy Jeralene Huff, NP    Social History:  reports that he has quit smoking. His smoking use included Cigarettes. He started smoking about 8 weeks ago. He has a 26 pack-year smoking history. He has never used smokeless tobacco. He reports that he drinks about 2.0 ounces of alcohol per week. He reports that he does not use illicit  drugs.  Family History  Problem Relation Age of Onset  . Asthma Brother   . Asthma Brother   . Asthma Brother   . Emphysema Brother   . Diabetes Mother   . Arthritis Mother   . Heart disease Mother   . Diabetes Brother   . Breast cancer Sister     Review of Systems:  Unable to obtain given current mental status.  Physical Exam: Blood pressure 118/58, pulse 92, temperature 98.5 F (36.9 C), temperature source Oral, resp. rate 18, SpO2 96.00%. General: Awake, not oriented, does not answer simple questions. HEENT: Normocephalic, atraumatic, physical and reactive to light, moist mucous membranes. Neck: Supple, no JVD, lymphadenopathy, bruits, no goiter. Cardiovascular: Regular rate and rhythm, do not auscultate any murmurs rubs or gallops. Lungs: Bilateral rhonchi. Abdomen: Soft, nontender, nondistended, positive bowel sounds, no masses organomegaly noted. Extremities: No clubbing, cyanosis or edema, positive pedal pulses. Left lower extremity is externally rotated and shortened as compared to the right. Neurologic: Moves all 4 spontaneously.  Labs on Admission:  Results for orders placed during the hospital encounter of 02/13/13 (from the past 48 hour(s))  BASIC METABOLIC PANEL     Status: Abnormal   Collection Time    02/13/13 12:22 PM      Result Value Range   Sodium 142  137 - 147 mEq/L   Potassium 5.0  3.7 - 5.3 mEq/L   Chloride 103  96 - 112 mEq/L   CO2 20  19 - 32 mEq/L   Glucose, Bld 102 (*) 70 - 99 mg/dL   BUN 61 (*) 6 - 23 mg/dL   Creatinine, Ser 1.45 (*) 0.50 - 1.35 mg/dL   Calcium 9.3  8.4 - 10.5 mg/dL   GFR calc non Af Amer 46 (*) >90 mL/min   GFR calc Af Amer 54 (*) >90 mL/min   Comment: (NOTE)     The eGFR has been calculated using the CKD EPI equation.     This calculation has not been validated in all clinical situations.     eGFR's persistently <90 mL/min signify possible Chronic Kidney     Disease.  CBC WITH DIFFERENTIAL     Status: Abnormal    Collection Time    02/13/13 12:22 PM      Result Value Range   WBC 11.8 (*) 4.0 - 10.5 K/uL   RBC 3.53 (*) 4.22 - 5.81 MIL/uL   Hemoglobin 10.1 (*) 13.0 - 17.0 g/dL   HCT 29.5 (*) 39.0 - 52.0 %   MCV 83.6  78.0 - 100.0 fL   MCH 28.6  26.0 - 34.0 pg   MCHC 34.2  30.0 - 36.0 g/dL   RDW 14.5  11.5 - 15.5 %   Platelets 302  150 - 400 K/uL   Neutrophils Relative % 79 (*) 43 - 77 %   Lymphocytes Relative 12  12 - 46 %   Monocytes Relative  9  3 - 12 %   Eosinophils Relative 0  0 - 5 %   Basophils Relative 0  0 - 1 %   Neutro Abs 9.3 (*) 1.7 - 7.7 K/uL   Lymphs Abs 1.4  0.7 - 4.0 K/uL   Monocytes Absolute 1.1 (*) 0.1 - 1.0 K/uL   Eosinophils Absolute 0.0  0.0 - 0.7 K/uL   Basophils Absolute 0.0  0.0 - 0.1 K/uL   RBC Morphology POLYCHROMASIA PRESENT     WBC Morphology TOXIC GRANULATION    PROTIME-INR     Status: Abnormal   Collection Time    02/13/13 12:22 PM      Result Value Range   Prothrombin Time 15.8 (*) 11.6 - 15.2 seconds   INR 1.29  0.00 - 1.49  TYPE AND SCREEN     Status: None   Collection Time    02/13/13 12:30 PM      Result Value Range   ABO/RH(D) B POS     Antibody Screen NEG     Sample Expiration 02/16/2013    ABO/RH     Status: None   Collection Time    02/13/13 12:30 PM      Result Value Range   ABO/RH(D) B POS      Radiological Exams on Admission: Dg Chest 1 View  02/13/2013   CLINICAL DATA:  Pain post trauma  EXAM: CHEST - 1 VIEW  COMPARISON:  January 14, 2013  FINDINGS: Extensive fibrotic change throughout the mid and lower lung zones remains. There is milder scarring in the upper lobes bilaterally. There is no frank edema or consolidation. Heart is upper normal in size with normal pulmonary vascularity. No adenopathy. There is atherosclerotic change in aorta. No pneumothorax. 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 No acute fracture seen.  IMPRESSION: Extensive fibrotic change in mid lower lung zones  bilaterally. No frank edema or consolidation.   Electronically Signed   By: Lowella Grip M.D.   On: 02/13/2013 11:02   Dg Hip Complete Left  02/13/2013   CLINICAL DATA:  Pain post trauma  EXAM: LEFT HIP - COMPLETE 2+ VIEW  COMPARISON:  None.  FINDINGS: Frontal pelvis as well as frontal and lateral left hip images were obtained. There is a comminuted intertrochanteric femur fracture on the left with varus angulation at the fracture site. The lesser trochanter is avulsed medially. No other fractures. No dislocation. There is mild symmetric narrowing of both hip joints.  IMPRESSION: Comminuted intertrochanteric femur fracture on the left with varus angulation at the fracture site.   Electronically Signed   By: Lowella Grip M.D.   On: 02/13/2013 11:01    Assessment/Plan Principal Problem:   Hip fracture, intertrochanteric Active Problems:   ALCOHOL ABUSE   CIGARETTE SMOKER   NEUROPATHY   COPD   Frequent falls   Fracture of left hip   Left intertrochanteric hip fracture -Dr. Percell Miller with orthopedics has been consulted. -Patient understands that to proceed with repair of his hip if he were to require general anesthesia will be quite complicated given his severe COPD and interstitial lung disease. Family is clear that patient would want the surgery to happen as he would not want to be continually bedbound.  Frequent falls -Suspect a large component of this is related to his severe cognitive impairment from chronic alcohol abuse. -Will check TSH/B12/RPR.  Prior history of Alcohol abuse -Thiamine/folate.  DVT prophylaxis -Subcutaneous heparin.  CODE  STATUS -DO NOT RESUSCITATE as discussed with multiple family members at bedside.   Time Spent on Admission: 75 minutes  [facility] Pager: [phone] 02/13/2013, 6:42 PM

## 2013-02-13 NOTE — Consult Note (Signed)
ORTHOPAEDIC CONSULTATION  REQUESTING PHYSICIAN: Mikki Harbor*  Chief Complaint: fall onto left side, L intertroch fracture  HPI: Antonio Marshall is a 73 y.o. 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 Bilateral senile cataracts   . Macular degeneration, bilateral   . COPD (chronic obstructive pulmonary disease)   . Cigarette smoker   . CAD (coronary artery disease)     ?  Marland Kitchen Peripheral vascular disease   . Alcohol abuse   . Head trauma     closed  . Neuropathy   . Abnormality of gait   . Anxiety   . Shortness of breath    Past Surgical History  Procedure Laterality Date  . Appendectomy    . 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 Marital Status: Widowed    Spouse Name: N/A    Number of Children: 2  . Years of Education: N/A   Social History Main Topics  . Smoking status: Former Smoker -- 0.50 packs/day for 52 years    Types: Cigarettes    Start date: 12/16/2012  . Smokeless tobacco: Never Used     Comment: 1 pack every 2 days  . Alcohol Use: 2.0 oz/week    4 drink(s) per week  . Drug Use: No  . Sexual Activity: None   Other Topics Concern  . None   Social History Narrative  . None   Family History  Problem Relation Age of Onset  . Asthma Brother   . Asthma Brother   . Asthma Brother   . Emphysema Brother   . Diabetes Mother   . Arthritis Mother   . Heart disease Mother   . Diabetes Brother   . Breast cancer Sister    Allergies  Allergen Reactions  . Neomycin-Bacitracin Zn-Polymyx Rash   Prior to Admission medications   Medication Sig Start Date End Date Taking? Authorizing Provider  acetaminophen (TYLENOL) 325 MG tablet Take 2 tablets (650 mg total) by mouth every 4 (four) hours as needed for mild pain, moderate pain or headache. 12/16/12  Yes Delfina Redwood, MD  albuterol (ACCUNEB) 0.63 MG/3ML nebulizer solution Take 1 ampule by nebulization 4  (four) times daily. 12/16/12  Yes Delfina Redwood, MD  dextromethorphan-guaiFENesin (ROBITUSSIN-DM) 10-100 MG/5ML liquid Take 15 mLs by mouth 4 (four) times daily.   Yes Historical Provider, MD  Fluticasone-Salmeterol (ADVAIR) 250-50 MCG/DOSE AEPB Inhale 1 puff into the lungs 2 (two) times daily.   Yes Historical Provider, MD  guaiFENesin (MUCINEX) 600 MG 12 hr tablet Take 600 mg by mouth 2 (two) times daily as needed for cough.    Yes Historical Provider, MD  HYDROcodone-acetaminophen (NORCO/VICODIN) 5-325 MG per tablet Take 1 tablet by mouth 3 (three) times daily.   Yes Historical Provider, MD  ipratropium (ATROVENT) 0.02 % nebulizer solution Take 0.5 mg by nebulization 4 (four) times daily.   Yes Historical Provider, MD  levofloxacin (LEVAQUIN) 500 MG tablet Take 500 mg by mouth daily.   Yes Historical Provider, MD  methylPREDNISolone (MEDROL) 4 MG tablet Take 2 mg by mouth daily. 01/14/13  Yes Tammy S Parrett, NP  Multiple Vitamin (MULTIVITAMIN WITH MINERALS) TABS tablet Take 1 tablet by mouth daily.   Yes Historical Provider, MD  predniSONE (DELTASONE) 10 MG tablet Take 1 tablet (10 mg total) by mouth daily with breakfast. For 2 days  12/16/12  Yes Delfina Redwood, MD  sertraline (ZOLOFT) 100 MG tablet Take 1 tablet (100 mg total) by mouth daily. 01/14/13  Yes Melvenia Needles, NP   Dg Chest 1 View  02/13/2013   CLINICAL DATA:  Pain post trauma  EXAM: CHEST - 1 VIEW  COMPARISON:  January 14, 2013  FINDINGS: Extensive fibrotic change throughout the mid and lower lung zones remains. There is milder scarring in the upper lobes bilaterally. There is no frank edema or consolidation. Heart is upper normal in size with normal pulmonary vascularity. No adenopathy. There is atherosclerotic change in aorta. No pneumothorax. 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 No acute fracture seen.  IMPRESSION: Extensive fibrotic change in mid lower lung zones  bilaterally. No frank edema or consolidation.   Electronically Signed   By: Lowella Grip M.D.   On: 02/13/2013 11:02   Dg Hip Complete Left  02/13/2013   CLINICAL DATA:  Pain post trauma  EXAM: LEFT HIP - COMPLETE 2+ VIEW  COMPARISON:  None.  FINDINGS: Frontal pelvis as well as frontal and lateral left hip images were obtained. There is a comminuted intertrochanteric femur fracture on the left with varus angulation at the fracture site. The lesser trochanter is avulsed medially. No other fractures. No dislocation. There is mild symmetric narrowing of both hip joints.  IMPRESSION: Comminuted intertrochanteric femur fracture on the left with varus angulation at the fracture site.   Electronically Signed   By: Lowella Grip M.D.   On: 02/13/2013 11:01    Positive ROS: All other systems have been reviewed and were otherwise negative with the exception of those mentioned in the HPI and as above.  Labs cbc  Recent Labs  02/13/13 1222  WBC 11.8*  HGB 10.1*  HCT 29.5*  PLT 302    Labs inflam No results found for this basename: ESR, CRP,  in the last 72 hours  Labs coag  Recent Labs  02/13/13 1222  INR 1.29     Recent Labs  02/13/13 1222  NA 142  K 5.0  CL 103  CO2 20  GLUCOSE 102*  BUN 61*  CREATININE 1.45*  CALCIUM 9.3    Physical Exam: Filed Vitals:   02/13/13 1700  BP: 118/58  Pulse: 92  Temp: 98.5 F (36.9 C)  Resp: 18   General: o acute distress Cardiovascular: No pedal edema Respiratory: No cyanosis, no use of accessory musculature GI: No organomegaly, abdomen is soft and non-tender Skin: No lesions in the area of chief complaint Neurologic: Sensation intact distally but exam limited Psychiatric: Not oriented Lymphatic: No axillary or cervical lymphadenopathy  MUSCULOSKELETAL:  Pain with log roll, skin benign, mental status limits NV exam, 2+ pulses, wiggles toes on L Other extremities are atraumatic with painless ROM and NVI.  Assessment: L  hip fracture  Plan: IM nail fixation with spinal vs general anesthesia Weight Bearing Status: Bedrest until post op PT VTE px: SCD's and hold chemical until post op.  Will discuss need for further pre-op work up with medicine otherwise plan for OR today   Renette Butters, MD Cell [phone]   02/13/2013 7:57 PM

## 2013-02-13 NOTE — Progress Notes (Signed)
Pt admitted from ED with L hip fx. Family at bedside. Pt remains confused as per his baseline. Pt noted to have a left shoulder bruise and an abrasion to L elbow. L elbow has an opsite to area. No s/sx infection. LLE outer leg rotation. R elbow is red. No pressure skin issues noted of heels or sacral area. Pt remains incontinent. Condom cath applied.

## 2013-02-13 NOTE — ED Provider Notes (Signed)
CSN: [id number]     Arrival date & time 02/13/13  0913 History   First MD Initiated Contact with Patient 02/13/13 1002     Chief Complaint  Patient presents with  . Hip Injury  level 5 caveat due to dementia  (Consider location/radiation/quality/duration/timing/severity/associated sxs/prior Treatment) The history is provided by the patient, a relative and medical records.   patient admitted to the ED with x-ray showing left intertrochanteric fracture an x-ray report also showed possible pneumonia. The images are not available on either. Patient is a DO NOT RESUSCITATE. Has been on antibiotics.  Past Medical History  Diagnosis Date  . Bilateral senile cataracts   . Macular degeneration, bilateral   . COPD (chronic obstructive pulmonary disease)   . Cigarette smoker   . CAD (coronary artery disease)     ?  Marland Kitchen. Peripheral vascular disease   . Alcohol abuse   . Head trauma     closed  . Neuropathy   . Abnormality of gait   . Anxiety   . Shortness of breath    Past Surgical History  Procedure Laterality Date  . Appendectomy    . 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 Asthma Brother   . Asthma Brother   . Asthma Brother   . Emphysema Brother   . Diabetes Mother   . Arthritis Mother   . Heart disease Mother   . Diabetes Brother   . Breast cancer Sister    History  Substance Use Topics  . Smoking status: Former Smoker -- 0.50 packs/day for 52 years    Types: Cigarettes    Start date: 12/16/2012  . Smokeless tobacco: Never Used     Comment: 1 pack every 2 days  . Alcohol Use: 2.0 oz/week    4 drink(s) per week    Review of Systems  Unable to perform ROS   Allergies  Neomycin-bacitracin zn-polymyx  Home Medications   Current Outpatient Rx  Name  Route  Sig  Dispense  Refill  . acetaminophen (TYLENOL) 325 MG tablet   Oral   Take 2 tablets (650 mg total) by mouth every 4 (four) hours as needed for mild pain, moderate  pain or headache.         . albuterol (ACCUNEB) 0.63 MG/3ML nebulizer solution   Nebulization   Take 1 ampule by nebulization 4 (four) times daily.         Marland Kitchen. dextromethorphan-guaiFENesin (ROBITUSSIN-DM) 10-100 MG/5ML liquid   Oral   Take 15 mLs by mouth 4 (four) times daily.         . Fluticasone-Salmeterol (ADVAIR) 250-50 MCG/DOSE AEPB   Inhalation   Inhale 1 puff into the lungs 2 (two) times daily.         Marland Kitchen. guaiFENesin (MUCINEX) 600 MG 12 hr tablet   Oral   Take 600 mg by mouth 2 (two) times daily as needed for cough.          Marland Kitchen. HYDROcodone-acetaminophen (NORCO/VICODIN) 5-325 MG per tablet   Oral   Take 1 tablet by mouth 3 (three) times daily.         Marland Kitchen. ipratropium (ATROVENT) 0.02 % nebulizer solution   Nebulization   Take 0.5 mg by nebulization 4 (four) times daily.         Marland Kitchen. levofloxacin (LEVAQUIN) 500 MG tablet   Oral   Take 500 mg by mouth daily.         .Marland Kitchen  methylPREDNISolone (MEDROL) 4 MG tablet   Oral   Take 2 mg by mouth daily.         . Multiple Vitamin (MULTIVITAMIN WITH MINERALS) TABS tablet   Oral   Take 1 tablet by mouth daily.         . predniSONE (DELTASONE) 10 MG tablet   Oral   Take 1 tablet (10 mg total) by mouth daily with breakfast. For 2 days         . sertraline (ZOLOFT) 100 MG tablet   Oral   Take 1 tablet (100 mg total) by mouth daily.   30 tablet   6    BP 131/93  Pulse 89  Temp(Src) 98.3 F (36.8 C) (Oral)  Resp 18  SpO2 96% Physical Exam  Constitutional: He appears well-developed and well-nourished.  HENT:  Head: Normocephalic.  Eyes: Pupils are equal, round, and reactive to light.  Neck: Neck supple.  Cardiovascular: Normal rate and regular rhythm.   Pulmonary/Chest: Effort normal.  Mildly harsh breath sounds.  Abdominal: Soft. There is no tenderness.  Musculoskeletal: He exhibits tenderness.  Left lower sternal he shortened with some mild external rotation. He will await all toes and feel sensation  of foot. Pulse intact on the foot. Tenderness over hip with decreased range of motion at hip. Skin tear to left elbow, but no tenderness. No other extremity tenderness.    ED Course  Procedures (including critical care time) Labs Review Labs Reviewed  BASIC METABOLIC PANEL - Abnormal; Notable for the following:    Glucose, Bld 102 (*)    BUN 61 (*)    Creatinine, Ser 1.45 (*)    GFR calc non Af Amer 46 (*)    GFR calc Af Amer 54 (*)    All other components within normal limits  CBC WITH DIFFERENTIAL - Abnormal; Notable for the following:    WBC 11.8 (*)    RBC 3.53 (*)    Hemoglobin 10.1 (*)    HCT 29.5 (*)    Neutrophils Relative % 79 (*)    Neutro Abs 9.3 (*)    Monocytes Absolute 1.1 (*)    All other components within normal limits  PROTIME-INR - Abnormal; Notable for the following:    Prothrombin Time 15.8 (*)    All other components within normal limits  TYPE AND SCREEN  ABO/RH   Imaging Review Dg Chest 1 View  02/13/2013   CLINICAL DATA:  Pain post trauma  EXAM: CHEST - 1 VIEW  COMPARISON:  January 14, 2013  FINDINGS: Extensive fibrotic change throughout the mid and lower lung zones remains. There is milder scarring in the upper lobes bilaterally. There is no frank edema or consolidation. Heart is upper normal in size with normal pulmonary vascularity. No adenopathy. There is atherosclerotic change in aorta. No pneumothorax. 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 No acute fracture seen.  IMPRESSION: Extensive fibrotic change in mid lower lung zones bilaterally. No frank edema or consolidation.   Electronically Signed   By: Bretta Bang M.D.   On: 02/13/2013 11:02   Dg Hip Complete Left  02/13/2013   CLINICAL DATA:  Pain post trauma  EXAM: LEFT HIP - COMPLETE 2+ VIEW  COMPARISON:  None.  FINDINGS: Frontal pelvis as well as frontal and lateral left hip images were obtained. There is a comminuted intertrochanteric femur  fracture on the left with varus angulation at the fracture site. The lesser trochanter  is avulsed medially. No other fractures. No dislocation. There is mild symmetric narrowing of both hip joints.  IMPRESSION: Comminuted intertrochanteric femur fracture on the left with varus angulation at the fracture site.   Electronically Signed   By: Bretta Bang M.D.   On: 02/13/2013 11:01    EKG Interpretation   None       MDM   1. Hip fracture, intertrochanteric    Patient with left hip fracture. Also abrasion to left elbow, but no apparent tenderness. Patient has a DO NOT RESUSCITATE and is mostly comfort care, however after discussion with family they would do surgery. Will be admitted to internal medicine with   orthopedist consult. Discussed with Dr. Eulah Pont. Possible OR tomorrow.   Juliet Rude. Rubin Payor, MD 02/13/13 [phone]

## 2013-02-14 ENCOUNTER — Encounter (HOSPITAL_COMMUNITY): Payer: Medicare Other | Admitting: Certified Registered"

## 2013-02-14 ENCOUNTER — Encounter (HOSPITAL_COMMUNITY)
Admission: EM | Disposition: A | Discharge status: Skilled Nursing Facility | Payer: Self-pay | Source: Home / Self Care | Attending: Internal Medicine

## 2013-02-14 ENCOUNTER — Inpatient Hospital Stay (HOSPITAL_COMMUNITY): Payer: Medicare Other | Admitting: Certified Registered"

## 2013-02-14 ENCOUNTER — Inpatient Hospital Stay (HOSPITAL_COMMUNITY): Payer: Medicare Other

## 2013-02-14 DIAGNOSIS — F411 Generalized anxiety disorder: Secondary | ICD-10-CM

## 2013-02-14 DIAGNOSIS — F17200 Nicotine dependence, unspecified, uncomplicated: Secondary | ICD-10-CM

## 2013-02-14 DIAGNOSIS — R269 Unspecified abnormalities of gait and mobility: Secondary | ICD-10-CM

## 2013-02-14 DIAGNOSIS — N179 Acute kidney failure, unspecified: Secondary | ICD-10-CM | POA: Diagnosis present

## 2013-02-14 DIAGNOSIS — D649 Anemia, unspecified: Secondary | ICD-10-CM

## 2013-02-14 HISTORY — PX: FEMUR IM NAIL: SHX1597

## 2013-02-14 LAB — BASIC METABOLIC PANEL
BUN: 51 mg/dL — ABNORMAL HIGH (ref 6–23)
CALCIUM: 8.8 mg/dL (ref 8.4–10.5)
CO2: 20 mEq/L (ref 19–32)
CREATININE: 1.26 mg/dL (ref 0.50–1.35)
Chloride: 107 mEq/L (ref 96–112)
GFR, EST AFRICAN AMERICAN: 64 mL/min — AB (ref >90)
GFR, EST NON AFRICAN AMERICAN: 55 mL/min — AB (ref >90)
Glucose, Bld: 115 mg/dL — ABNORMAL HIGH (ref 70–99)
Potassium: 4.5 mEq/L (ref 3.7–5.3)
Sodium: 144 mEq/L (ref 137–147)

## 2013-02-14 LAB — VITAMIN B12: Vitamin B-12: 366 pg/mL (ref 211–911)

## 2013-02-14 LAB — CBC
HCT: 27.9 % — ABNORMAL LOW (ref 39.0–52.0)
Hemoglobin: 9.1 g/dL — ABNORMAL LOW (ref 13.0–17.0)
MCH: 27.8 pg (ref 26.0–34.0)
MCHC: 32.6 g/dL (ref 30.0–36.0)
MCV: 85.3 fL (ref 78.0–100.0)
PLATELETS: 312 10*3/uL (ref 150–400)
RBC: 3.27 MIL/uL — ABNORMAL LOW (ref 4.22–5.81)
RDW: 14.6 % (ref 11.5–15.5)
WBC: 11.7 10*3/uL — AB (ref 4.0–10.5)

## 2013-02-14 LAB — TSH: TSH: 0.676 u[IU]/mL (ref 0.350–4.500)

## 2013-02-14 LAB — RPR: RPR: NONREACTIVE

## 2013-02-14 SURGERY — INSERTION, INTRAMEDULLARY ROD, FEMUR
Anesthesia: General | Site: Hip | Laterality: Left

## 2013-02-14 MED ORDER — NEOSTIGMINE METHYLSULFATE 1 MG/ML IJ SOLN
INTRAMUSCULAR | Status: DC | PRN
  Administered 2013-02-14: 4 mg via INTRAVENOUS

## 2013-02-14 MED ORDER — HYDROCODONE-ACETAMINOPHEN 5-325 MG PO TABS: 2.0000 | ORAL_TABLET | ORAL | Status: DC | PRN

## 2013-02-14 MED ORDER — ONDANSETRON HCL 4 MG PO TABS: 4.0000 mg | ORAL_TABLET | Freq: Four times a day (QID) | ORAL | Status: DC | PRN

## 2013-02-14 MED ORDER — LACTATED RINGERS IV SOLN: INTRAVENOUS | Status: DC

## 2013-02-14 MED ORDER — PHENOL 1.4 % MT LIQD: 1.0000 | OROMUCOSAL | Status: DC | PRN

## 2013-02-14 MED ORDER — METOCLOPRAMIDE HCL 10 MG PO TABS: 5.0000 mg | ORAL_TABLET | Freq: Three times a day (TID) | ORAL | Status: DC | PRN

## 2013-02-14 MED ORDER — ACETAMINOPHEN 500 MG PO TABS: 1000.0000 mg | ORAL_TABLET | Freq: Once | ORAL | Status: DC

## 2013-02-14 MED ORDER — LACTATED RINGERS IV SOLN
INTRAVENOUS | Status: DC
  Administered 2013-02-14: 16:00:00 via INTRAVENOUS

## 2013-02-14 MED ORDER — ROCURONIUM BROMIDE 100 MG/10ML IV SOLN
INTRAVENOUS | Status: DC | PRN
  Administered 2013-02-14: 30 mg via INTRAVENOUS

## 2013-02-14 MED ORDER — LIDOCAINE HCL (CARDIAC) 20 MG/ML IV SOLN
INTRAVENOUS | Status: DC | PRN
  Administered 2013-02-14: 50 mg via INTRAVENOUS

## 2013-02-14 MED ORDER — DOCUSATE SODIUM 100 MG PO CAPS: 100.0000 mg | ORAL_CAPSULE | Freq: Two times a day (BID) | ORAL | Status: AC

## 2013-02-14 MED ORDER — ONDANSETRON HCL 4 MG/2ML IJ SOLN
INTRAMUSCULAR | Status: DC | PRN
  Administered 2013-02-14: 4 mg via INTRAVENOUS

## 2013-02-14 MED ORDER — CHLORHEXIDINE GLUCONATE CLOTH 2 % EX PADS: 6.0000 | MEDICATED_PAD | Freq: Once | CUTANEOUS | Status: DC

## 2013-02-14 MED ORDER — ACETAMINOPHEN 325 MG PO TABS
650.0000 mg | ORAL_TABLET | Freq: Four times a day (QID) | ORAL | Status: DC | PRN
  Administered 2013-02-16 (×2): 650 mg via ORAL
  Filled 2013-02-14 (×2): qty 2

## 2013-02-14 MED ORDER — CHLORHEXIDINE GLUCONATE 4 % EX LIQD
60.0000 mL | Freq: Once | CUTANEOUS | Status: DC
  Filled 2013-02-14: qty 60

## 2013-02-14 MED ORDER — ACETAMINOPHEN 650 MG RE SUPP
650.0000 mg | Freq: Four times a day (QID) | RECTAL | Status: DC | PRN
Start: 2013-02-14 — End: 2013-02-20

## 2013-02-14 MED ORDER — LACTATED RINGERS IV SOLN
INTRAVENOUS | Status: DC | PRN
  Administered 2013-02-14: 16:00:00 via INTRAVENOUS

## 2013-02-14 MED ORDER — 0.9 % SODIUM CHLORIDE (POUR BTL) OPTIME
TOPICAL | Status: DC | PRN
  Administered 2013-02-14: 1000 mL

## 2013-02-14 MED ORDER — ASPIRIN EC 325 MG PO TBEC
325.0000 mg | DELAYED_RELEASE_TABLET | Freq: Every day | ORAL | Status: DC
  Administered 2013-02-15 – 2013-02-20 (×6): 325 mg via ORAL
  Filled 2013-02-14 (×8): qty 1

## 2013-02-14 MED ORDER — FENTANYL CITRATE 0.05 MG/ML IJ SOLN: 25.0000 ug | INTRAMUSCULAR | Status: DC | PRN

## 2013-02-14 MED ORDER — ONDANSETRON HCL 4 MG/2ML IJ SOLN: 4.0000 mg | Freq: Four times a day (QID) | INTRAMUSCULAR | Status: DC | PRN

## 2013-02-14 MED ORDER — FENTANYL CITRATE 0.05 MG/ML IJ SOLN
INTRAMUSCULAR | Status: DC | PRN
Start: 2013-02-14 — End: 2013-02-14
  Administered 2013-02-14: 50 ug via INTRAVENOUS
  Administered 2013-02-14: 150 ug via INTRAVENOUS

## 2013-02-14 MED ORDER — CEFAZOLIN SODIUM-DEXTROSE 2-3 GM-% IV SOLR
2.0000 g | INTRAVENOUS | Status: DC
  Filled 2013-02-14: qty 50

## 2013-02-14 MED ORDER — ASPIRIN EC 325 MG PO TBEC: 325.0000 mg | DELAYED_RELEASE_TABLET | Freq: Every day | ORAL | Status: AC

## 2013-02-14 MED ORDER — METOCLOPRAMIDE HCL 5 MG/ML IJ SOLN: 5.0000 mg | Freq: Three times a day (TID) | INTRAMUSCULAR | Status: DC | PRN

## 2013-02-14 MED ORDER — MENTHOL 3 MG MT LOZG: 1.0000 | LOZENGE | OROMUCOSAL | Status: DC | PRN

## 2013-02-14 MED ORDER — EPHEDRINE SULFATE 50 MG/ML IJ SOLN
INTRAMUSCULAR | Status: DC | PRN
  Administered 2013-02-14: 10 mg via INTRAVENOUS

## 2013-02-14 MED ORDER — DEXTROSE-NACL 5-0.45 % IV SOLN
INTRAVENOUS | Status: AC
  Administered 2013-02-14: 22:00:00 via INTRAVENOUS

## 2013-02-14 MED ORDER — GLYCOPYRROLATE 0.2 MG/ML IJ SOLN
INTRAMUSCULAR | Status: DC | PRN
  Administered 2013-02-14: .7 mg via INTRAVENOUS

## 2013-02-14 MED ORDER — CEFAZOLIN SODIUM-DEXTROSE 2-3 GM-% IV SOLR
2.0000 g | Freq: Four times a day (QID) | INTRAVENOUS | Status: AC
  Administered 2013-02-14 – 2013-02-15 (×2): 2 g via INTRAVENOUS
  Filled 2013-02-14 (×2): qty 50

## 2013-02-14 MED ORDER — PROPOFOL 10 MG/ML IV BOLUS
INTRAVENOUS | Status: DC | PRN
  Administered 2013-02-14: 110 mg via INTRAVENOUS

## 2013-02-14 MED ORDER — METHYLPREDNISOLONE SODIUM SUCC 125 MG IJ SOLR
INTRAMUSCULAR | Status: DC | PRN
  Administered 2013-02-14: 125 mg via INTRAVENOUS

## 2013-02-14 MED ORDER — DEXTROSE-NACL 5-0.45 % IV SOLN
100.0000 mL/h | INTRAVENOUS | Status: DC
  Administered 2013-02-14: 100 mL/h via INTRAVENOUS

## 2013-02-14 SURGICAL SUPPLY — 38 items
BIT DRILL AO GAMMA 4.2X180 (BIT) ×3 IMPLANT
CLOSURE STERI-STRIP 1/2X4 (GAUZE/BANDAGES/DRESSINGS) ×2
CLOTH BEACON ORANGE TIMEOUT ST (SAFETY) ×3 IMPLANT
CLSR STERI-STRIP ANTIMIC 1/2X4 (GAUZE/BANDAGES/DRESSINGS) ×4 IMPLANT
COVER SURGICAL LIGHT HANDLE (MISCELLANEOUS) ×3 IMPLANT
DRAPE STERI IOBAN 125X83 (DRAPES) ×3 IMPLANT
DRSG EMULSION OIL 3X3 NADH (GAUZE/BANDAGES/DRESSINGS) ×3 IMPLANT
DRSG MEPILEX BORDER 4X4 (GAUZE/BANDAGES/DRESSINGS) ×6 IMPLANT
DRSG TEGADERM 4X4.75 (GAUZE/BANDAGES/DRESSINGS) IMPLANT
DURAPREP 26ML APPLICATOR (WOUND CARE) ×3 IMPLANT
ELECT REM PT RETURN 9FT ADLT (ELECTROSURGICAL) ×3
ELECTRODE REM PT RTRN 9FT ADLT (ELECTROSURGICAL) ×1 IMPLANT
GLOVE BIO SURGEON STRL SZ7.5 (GLOVE) ×3 IMPLANT
GLOVE BIOGEL PI IND STRL 8 (GLOVE) ×1 IMPLANT
GLOVE BIOGEL PI INDICATOR 8 (GLOVE) ×2
GOWN STRL NON-REIN LRG LVL3 (GOWN DISPOSABLE) ×3 IMPLANT
GUIDEROD T2 3X1000 (ROD) ×3 IMPLANT
K-WIRE  3.2X450M STR (WIRE) ×2
K-WIRE 3.2X450M STR (WIRE) ×1
KIT BASIN OR (CUSTOM PROCEDURE TRAY) ×3 IMPLANT
KIT NAIL LONG 10X380MM (Orthopedic Implant) ×3 IMPLANT
KIT ROOM TURNOVER OR (KITS) ×3 IMPLANT
KWIRE 3.2X450M STR (WIRE) ×1 IMPLANT
MANIFOLD NEPTUNE II (INSTRUMENTS) IMPLANT
NS IRRIG 1000ML POUR BTL (IV SOLUTION) ×3 IMPLANT
PACK GENERAL/GYN (CUSTOM PROCEDURE TRAY) ×3 IMPLANT
PAD ARMBOARD 7.5X6 YLW CONV (MISCELLANEOUS) ×6 IMPLANT
SCREW LAG GAMMA 3 TI 10.5X90MM (Screw) ×3 IMPLANT
SPONGE GAUZE 4X4 12PLY (GAUZE/BANDAGES/DRESSINGS) ×3 IMPLANT
STAPLER VISISTAT 35W (STAPLE) IMPLANT
SUT MNCRL AB 4-0 PS2 18 (SUTURE) ×3 IMPLANT
SUT MON AB 2-0 CT1 36 (SUTURE) ×3 IMPLANT
SUT VIC AB 0 CT1 27 (SUTURE) ×2
SUT VIC AB 0 CT1 27XBRD ANBCTR (SUTURE) ×1 IMPLANT
TOWEL OR 17X24 6PK STRL BLUE (TOWEL DISPOSABLE) ×3 IMPLANT
TOWEL OR 17X26 10 PK STRL BLUE (TOWEL DISPOSABLE) ×3 IMPLANT
TOWEL OR NON WOVEN STRL DISP B (DISPOSABLE) ×3 IMPLANT
WATER STERILE IRR 1000ML POUR (IV SOLUTION) IMPLANT

## 2013-02-14 NOTE — Op Note (Signed)
DATE OF SURGERY:  02/14/2013  TIME: 5:06 PM  PATIENT NAME:  Antonio Marshall L Lerman  AGE: 73 y.o.  PRE-OPERATIVE DIAGNOSIS:  hip fx  POST-OPERATIVE DIAGNOSIS:  SAME  PROCEDURE:  INTRAMEDULLARY (IM) NAIL FEMORAL  SURGEON:  Dulcy Sida, D  ASSISTANT:  Janace LittenBrandon Parry OPA  OPERATIVE IMPLANTS: Stryker Gamma Nail with distal interlock screw  PREOPERATIVE INDICATIONS:  Antonio Marshall L Hedberg is a 73 y.o. year old who fell and suffered a hip fracture. He was brought into the ER and then admitted and optimized and then elected for surgical intervention.    The risks benefits and alternatives were discussed with the patient including but not limited to the risks of nonoperative treatment, versus surgical intervention including infection, bleeding, nerve injury, malunion, nonunion, hardware prominence, hardware failure, need for hardware removal, blood clots, cardiopulmonary complications, morbidity, mortality, among others, and they were willing to proceed.    OPERATIVE PROCEDURE:  The patient was brought to the operating room and placed in the supine position. General anesthesia was administered, with a foley. He was placed on the fracture table.  Closed reduction was performed under C-arm guidance. The length of the femur was also measured using fluoroscopy. Time out was then performed after sterile prep and drape. He received preoperative antibiotics.  Incision was made proximal to the greater trochanter. A guidewire was placed in the appropriate position. Confirmation was made on AP and lateral views. The above-named nail was opened. I opened the proximal femur with a reamer. I then placed the nail by hand easily down. I did not need to ream the femur.  Once the nail was completely seated, I placed a guidepin into the femoral head into the center center position. I measured the length, and then reamed the lateral cortex and up into the head. I then placed the lag screw. Slight compression was applied.  Anatomic fixation achieved. Bone quality was mediocre.  I then secured the proximal interlocking bolt, and took off a half a turn, and then removed the instruments, and took final C-arm pictures AP and lateral the entire length of the leg.   Anatomic reconstruction was achieved, and the wounds were irrigated copiously and closed with Vicryl followed by staples and sterile gauze for the skin. The patient was awakened and returned to PACU in stable and satisfactory condition. There no complications and the patient tolerated the procedure well.  He will be weightbearing as tolerated, and will be on ASA 325  for a period of four weeks after discharge.   Margarita Ranaimothy Yarimar Lavis, M.D.

## 2013-02-14 NOTE — Transfer of Care (Signed)
Immediate Anesthesia Transfer of Care Note  Patient: Antonio OsgoodWarren L Marshall  Procedure(s) Performed: Procedure(s): INTRAMEDULLARY (IM) NAIL FEMORAL (Left)  Patient Location: PACU  Anesthesia Type:General  Level of Consciousness: awake and alert   Airway & Oxygen Therapy: Patient Spontanous Breathing and Patient connected to nasal cannula oxygen  Post-op Assessment: Report given to PACU RN and Post -op Vital signs reviewed and stable  Post vital signs: Reviewed and stable  Complications: No apparent anesthesia complications

## 2013-02-14 NOTE — Interval H&P Note (Signed)
History and Physical Interval Note:  02/14/2013 8:30 AM  Antonio Marshall  has presented today for surgery, with the diagnosis of hip fx  The various methods of treatment have been discussed with the patient and family. After consideration of risks, benefits and other options for treatment, the patient has consented to  Procedure(s): INTRAMEDULLARY (IM) NAIL FEMORAL (Left) as a surgical intervention .  The patient's history has been reviewed, patient examined, no change in status, stable for surgery.  I have reviewed the patient's chart and labs.  Questions were answered to the patient's satisfaction.     MURPHY, TIMOTHY, D

## 2013-02-14 NOTE — Progress Notes (Signed)
[facility]                                                                              Patient Demographics  Antonio Marshall, is a 73 y.o. male, DOB - 08/08/1940, JXB:[id number]  Admit date - 02/13/2013   Admitting Physician Henderson Cloud, MD  Outpatient Primary MD for the patient is Michele Mcalpine, MD  LOS - 1   Chief Complaint  Patient presents with  . Hip Injury        Assessment & Plan    Principal Problem:   Hip fracture, intertrochanteric Active Problems:   ALCOHOL ABUSE   CIGARETTE SMOKER   NEUROPATHY   COPD   Frequent falls   Fracture of left hip  Left intertrochanteric hip fracture  -Dr. Eulah Pont with orthopedics has been consulted.  -Patient is moderate risk due to his prolonged history of COPD and interstitial lung disease -Family is concerned regarding intubation during surgery -Spoke with Dr. Eulah Pont, and surgery will occur later today -continue pain control -Spoke with CM/SW regarding SNF placement  Frequent falls  -Suspect a large component of this is related to his severe cognitive impairment from chronic alcohol abuse.  -TSH 0.676, B12 366, Folate >20, RPR negative -Will assess after surgery  Prior history of Alcohol abuse  -According to family, patient has been sober for over one year -Will continue thiamine, multivitamin, and folate.  Acute kidney Injury -Cr improving 1.26, likely secondary to dehyration -Will continue to monitor  Anemia, normacytic -H/H 9.1/27.9, baseline Hb 11 -Will continue to monitor -Will obtain anemia panel  Code Status: DNR  Family Communication: Sister at bedside  Disposition Plan: Admitted  Time Spent in minutes   35 minutes  Procedures None  Consults   Orthopedic surgery  DVT Prophylaxis  Heparin   Lab Results  Component Value Date   PLT 312 02/14/2013    Medications  Scheduled Meds: . acetaminophen  1,000 mg Oral Once  .  ceFAZolin (ANCEF) IV  2 g Intravenous On Call to OR    . chlorhexidine  60 mL Topical Once  . Chlorhexidine Gluconate Cloth  6 each Topical Once   And  . Chlorhexidine Gluconate Cloth  6 each Topical Once  . folic acid  1 mg Oral Daily  . heparin  5,000 Units Subcutaneous Q8H  . methylPREDNISolone  2 mg Oral Daily  . mometasone-formoterol  2 puff Inhalation BID  . multivitamin with minerals  1 tablet Oral Daily  . sertraline  100 mg Oral Daily  . thiamine  100 mg Oral Daily   Continuous Infusions: . sodium chloride 100 mL/hr at 02/14/13 0500  . dextrose 5 % and 0.45% NaCl 100 mL/hr (02/14/13 0500)   PRN Meds:.albuterol, HYDROcodone-acetaminophen, methocarbamol (ROBAXIN) IV, methocarbamol, morphine injection, senna-docusate  Antibiotics    Anti-infectives   Start     Dose/Rate Route Frequency Ordered Stop   02/14/13 0600  ceFAZolin (ANCEF) IVPB 2 g/50 mL premix     2 g 100 mL/hr over 30 Minutes Intravenous On call to O.R. 02/14/13 0449 02/15/13 0559        Subjective:   Antonio Marshall seen and examined today.  Patient states  that he cannot and left-sided partial paralysis. He states he's fallen several times in the last week. Patient continues to complain of hip pain.  Objective:   Filed Vitals:   02/14/13 0800 02/14/13 0840 02/14/13 1112 02/14/13 1300  BP:    96/39  Pulse:    76  Temp:    98 F (36.7 C)  TempSrc:    Oral  Resp: 20  18 18   SpO2: 97% 94% 94% 96%    Wt Readings from Last 3 Encounters:  01/14/13 61.145 kg (134 lb 12.8 oz)  12/13/12 60 kg (132 lb 4.4 oz)  06/25/12 56.065 kg (123 lb 9.6 oz)     Intake/Output Summary (Last 24 hours) at 02/14/13 1400 Last data filed at 02/14/13 1300  Gross per 24 hour  Intake   1440 ml  Output   1000 ml  Net    440 ml    Exam  General: Well developed, malnourished, NAD, appears stated age  HEENT: NCAT, PERRLA, EOMI, Anicteic Sclera, mucous membranes moist. No pharyngeal erythema or exudates  Neck: Supple, no JVD, no masses  Cardiovascular: S1 S2  auscultated, no rubs, murmurs or gallops. Regular rate and rhythm.  Respiratory: Scattered rhonchi  Abdomen: Soft, nontender, nondistended, + bowel sounds  Extremities: warm dry without cyanosis clubbing or edema.  Neuro: AAOx3, cranial nerves grossly intact.   Skin: Without rashes exudates or nodules   Data Review   Micro Results No results found for this or any previous visit (from the past 240 hour(s)).  Radiology Reports Dg Chest 1 View  02/13/2013   CLINICAL DATA:  Pain post trauma  EXAM: CHEST - 1 VIEW  COMPARISON:  January 14, 2013  FINDINGS: Extensive fibrotic change throughout the mid and lower lung zones remains. There is milder scarring in the upper lobes bilaterally. There is no frank edema or consolidation. Heart is upper normal in size with normal pulmonary vascularity. No adenopathy. There is atherosclerotic change in aorta. No pneumothorax. 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 No acute fracture seen.  IMPRESSION: Extensive fibrotic change in mid lower lung zones bilaterally. No frank edema or consolidation.   Electronically Signed   By: Bretta Bang M.D.   On: 02/13/2013 11:02   Dg Hip Complete Left  02/13/2013   CLINICAL DATA:  Pain post trauma  EXAM: LEFT HIP - COMPLETE 2+ VIEW  COMPARISON:  None.  FINDINGS: Frontal pelvis as well as frontal and lateral left hip images were obtained. There is a comminuted intertrochanteric femur fracture on the left with varus angulation at the fracture site. The lesser trochanter is avulsed medially. No other fractures. No dislocation. There is mild symmetric narrowing of both hip joints.  IMPRESSION: Comminuted intertrochanteric femur fracture on the left with varus angulation at the fracture site.   Electronically Signed   By: Bretta Bang M.D.   On: 02/13/2013 11:01    CBC  Recent Labs Lab 02/13/13 1222 02/14/13 0410  WBC 11.8* 11.7*  HGB 10.1* 9.1*  HCT 29.5*  27.9*  PLT 302 312  MCV 83.6 85.3  MCH 28.6 27.8  MCHC 34.2 32.6  RDW 14.5 14.6  LYMPHSABS 1.4  --   MONOABS 1.1*  --   EOSABS 0.0  --   BASOSABS 0.0  --     Chemistries   Recent Labs Lab 02/13/13 1222 02/14/13 0410  NA 142 144  K 5.0 4.5  CL 103 107  CO2 20  20  GLUCOSE 102* 115*  BUN 61* 51*  CREATININE 1.45* 1.26  CALCIUM 9.3 8.8   ------------------------------------------------------------------------------------------------------------------ CrCl is unknown because both a height and weight (above a minimum accepted value) are required for this calculation. ------------------------------------------------------------------------------------------------------------------ No results found for this basename: HGBA1C,  in the last 72 hours ------------------------------------------------------------------------------------------------------------------ No results found for this basename: CHOL, HDL, LDLCALC, TRIG, CHOLHDL, LDLDIRECT,  in the last 72 hours ------------------------------------------------------------------------------------------------------------------  Recent Labs  02/13/13 2020  TSH 0.676   ------------------------------------------------------------------------------------------------------------------  Recent Labs  02/13/13 2020  VITAMINB12 366    Coagulation profile  Recent Labs Lab 02/13/13 1222  INR 1.29    No results found for this basename: DDIMER,  in the last 72 hours  Cardiac Enzymes No results found for this basename: CK, CKMB, TROPONINI, MYOGLOBIN,  in the last 168 hours ------------------------------------------------------------------------------------------------------------------ No components found with this basename: POCBNP,     Temperence Zenor D.O. on 02/14/2013 at 2:00 PM  Between 7am to 7pm - Pager - [phone]  After 7pm go to [email or web address] - password [email or web address]  And look for the night coverage person covering  for me after hours  [facility] Office  [phone]

## 2013-02-14 NOTE — Anesthesia Preprocedure Evaluation (Addendum)
Anesthesia Evaluation  Patient identified by MRN, date of birth, ID band Patient awake    Reviewed: Allergy & Precautions, H&P , NPO status , Patient's Chart, lab work & pertinent test results  Airway Mallampati: II TM Distance: >3 FB Neck ROM: Full    Dental no notable dental hx. (+) Upper Dentures, Lower Dentures and Dental Advisory Given   Pulmonary shortness of breath, COPD COPD inhaler, former smoker,  breath sounds clear to auscultation  Pulmonary exam normal       Cardiovascular + CAD and + Peripheral Vascular Disease negative cardio ROS  Rhythm:Regular Rate:Normal     Neuro/Psych negative neurological ROS  negative psych ROS   GI/Hepatic negative GI ROS, Neg liver ROS,   Endo/Other  negative endocrine ROS  Renal/GU negative Renal ROS  negative genitourinary   Musculoskeletal   Abdominal   Peds  Hematology negative hematology ROS (+) anemia ,   Anesthesia Other Findings   Reproductive/Obstetrics negative OB ROS                          Anesthesia Physical Anesthesia Plan  ASA: III  Anesthesia Plan: General   Post-op Pain Management:    Induction: Intravenous  Airway Management Planned: Oral ETT  Additional Equipment:   Intra-op Plan:   Post-operative Plan: Extubation in OR  Informed Consent: I have reviewed the patients History and Physical, chart, labs and discussed the procedure including the risks, benefits and alternatives for the proposed anesthesia with the patient or authorized representative who has indicated his/her understanding and acceptance.   Dental advisory given  Plan Discussed with: CRNA  Anesthesia Plan Comments:         Anesthesia Quick Evaluation

## 2013-02-14 NOTE — Discharge Instructions (Signed)
Bear weight as tolerated  PT daily  Take an aspirin 325 daily

## 2013-02-14 NOTE — Preoperative (Signed)
Beta Blockers   Reason not to administer Beta Blockers:Not Applicable 

## 2013-02-14 NOTE — H&P (View-Only) (Signed)
ORTHOPAEDIC CONSULTATION  REQUESTING PHYSICIAN: Mikki Harbor*  Chief Complaint: fall onto left side, L intertroch fracture  HPI: Antonio Marshall is a 73 y.o. 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 Bilateral senile cataracts   . Macular degeneration, bilateral   . COPD (chronic obstructive pulmonary disease)   . Cigarette smoker   . CAD (coronary artery disease)     ?  Marland Kitchen Peripheral vascular disease   . Alcohol abuse   . Head trauma     closed  . Neuropathy   . Abnormality of gait   . Anxiety   . Shortness of breath    Past Surgical History  Procedure Laterality Date  . Appendectomy    . 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 Marital Status: Widowed    Spouse Name: N/A    Number of Children: 2  . Years of Education: N/A   Social History Main Topics  . Smoking status: Former Smoker -- 0.50 packs/day for 52 years    Types: Cigarettes    Start date: 12/16/2012  . Smokeless tobacco: Never Used     Comment: 1 pack every 2 days  . Alcohol Use: 2.0 oz/week    4 drink(s) per week  . Drug Use: No  . Sexual Activity: None   Other Topics Concern  . None   Social History Narrative  . None   Family History  Problem Relation Age of Onset  . Asthma Brother   . Asthma Brother   . Asthma Brother   . Emphysema Brother   . Diabetes Mother   . Arthritis Mother   . Heart disease Mother   . Diabetes Brother   . Breast cancer Sister    Allergies  Allergen Reactions  . Neomycin-Bacitracin Zn-Polymyx Rash   Prior to Admission medications   Medication Sig Start Date End Date Taking? Authorizing Provider  acetaminophen (TYLENOL) 325 MG tablet Take 2 tablets (650 mg total) by mouth every 4 (four) hours as needed for mild pain, moderate pain or headache. 12/16/12  Yes Delfina Redwood, MD  albuterol (ACCUNEB) 0.63 MG/3ML nebulizer solution Take 1 ampule by nebulization 4  (four) times daily. 12/16/12  Yes Delfina Redwood, MD  dextromethorphan-guaiFENesin (ROBITUSSIN-DM) 10-100 MG/5ML liquid Take 15 mLs by mouth 4 (four) times daily.   Yes Historical Provider, MD  Fluticasone-Salmeterol (ADVAIR) 250-50 MCG/DOSE AEPB Inhale 1 puff into the lungs 2 (two) times daily.   Yes Historical Provider, MD  guaiFENesin (MUCINEX) 600 MG 12 hr tablet Take 600 mg by mouth 2 (two) times daily as needed for cough.    Yes Historical Provider, MD  HYDROcodone-acetaminophen (NORCO/VICODIN) 5-325 MG per tablet Take 1 tablet by mouth 3 (three) times daily.   Yes Historical Provider, MD  ipratropium (ATROVENT) 0.02 % nebulizer solution Take 0.5 mg by nebulization 4 (four) times daily.   Yes Historical Provider, MD  levofloxacin (LEVAQUIN) 500 MG tablet Take 500 mg by mouth daily.   Yes Historical Provider, MD  methylPREDNISolone (MEDROL) 4 MG tablet Take 2 mg by mouth daily. 01/14/13  Yes Tammy S Parrett, NP  Multiple Vitamin (MULTIVITAMIN WITH MINERALS) TABS tablet Take 1 tablet by mouth daily.   Yes Historical Provider, MD  predniSONE (DELTASONE) 10 MG tablet Take 1 tablet (10 mg total) by mouth daily with breakfast. For 2 days  12/16/12  Yes Delfina Redwood, MD  sertraline (ZOLOFT) 100 MG tablet Take 1 tablet (100 mg total) by mouth daily. 01/14/13  Yes Melvenia Needles, NP   Dg Chest 1 View  02/13/2013   CLINICAL DATA:  Pain post trauma  EXAM: CHEST - 1 VIEW  COMPARISON:  January 14, 2013  FINDINGS: Extensive fibrotic change throughout the mid and lower lung zones remains. There is milder scarring in the upper lobes bilaterally. There is no frank edema or consolidation. Heart is upper normal in size with normal pulmonary vascularity. No adenopathy. There is atherosclerotic change in aorta. No pneumothorax. 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 No acute fracture seen.  IMPRESSION: Extensive fibrotic change in mid lower lung zones  bilaterally. No frank edema or consolidation.   Electronically Signed   By: Lowella Grip M.D.   On: 02/13/2013 11:02   Dg Hip Complete Left  02/13/2013   CLINICAL DATA:  Pain post trauma  EXAM: LEFT HIP - COMPLETE 2+ VIEW  COMPARISON:  None.  FINDINGS: Frontal pelvis as well as frontal and lateral left hip images were obtained. There is a comminuted intertrochanteric femur fracture on the left with varus angulation at the fracture site. The lesser trochanter is avulsed medially. No other fractures. No dislocation. There is mild symmetric narrowing of both hip joints.  IMPRESSION: Comminuted intertrochanteric femur fracture on the left with varus angulation at the fracture site.   Electronically Signed   By: Lowella Grip M.D.   On: 02/13/2013 11:01    Positive ROS: All other systems have been reviewed and were otherwise negative with the exception of those mentioned in the HPI and as above.  Labs cbc  Recent Labs  02/13/13 1222  WBC 11.8*  HGB 10.1*  HCT 29.5*  PLT 302    Labs inflam No results found for this basename: ESR, CRP,  in the last 72 hours  Labs coag  Recent Labs  02/13/13 1222  INR 1.29     Recent Labs  02/13/13 1222  NA 142  K 5.0  CL 103  CO2 20  GLUCOSE 102*  BUN 61*  CREATININE 1.45*  CALCIUM 9.3    Physical Exam: Filed Vitals:   02/13/13 1700  BP: 118/58  Pulse: 92  Temp: 98.5 F (36.9 C)  Resp: 18   General: o acute distress Cardiovascular: No pedal edema Respiratory: No cyanosis, no use of accessory musculature GI: No organomegaly, abdomen is soft and non-tender Skin: No lesions in the area of chief complaint Neurologic: Sensation intact distally but exam limited Psychiatric: Not oriented Lymphatic: No axillary or cervical lymphadenopathy  MUSCULOSKELETAL:  Pain with log roll, skin benign, mental status limits NV exam, 2+ pulses, wiggles toes on L Other extremities are atraumatic with painless ROM and NVI.  Assessment: L  hip fracture  Plan: IM nail fixation with spinal vs general anesthesia Weight Bearing Status: Bedrest until post op PT VTE px: SCD's and hold chemical until post op.  Will discuss need for further pre-op work up with medicine otherwise plan for OR today   Renette Butters, MD Cell [phone]   02/13/2013 7:57 PM

## 2013-02-14 NOTE — Anesthesia Procedure Notes (Signed)
Procedure Name: Intubation Date/Time: 02/14/2013 4:26 PM Performed by: Gwenyth AllegraADAMI, Antonio Marshall Pre-anesthesia Checklist: Patient identified, Timeout performed, Emergency Drugs available, Suction available and Patient being monitored Patient Re-evaluated:Patient Re-evaluated prior to inductionOxygen Delivery Method: Circle system utilized Preoxygenation: Pre-oxygenation with 100% oxygen Intubation Type: IV induction Ventilation: Mask ventilation without difficulty Laryngoscope Size: Mac Grade View: Grade I Tube type: Oral Tube size: 7.5 mm Number of attempts: 1 Airway Equipment and Method: Stylet Secured at: 21 cm Tube secured with: Tape Dental Injury: Teeth and Oropharynx as per pre-operative assessment

## 2013-02-14 NOTE — Anesthesia Postprocedure Evaluation (Signed)
  Anesthesia Post-op Note  Patient: Antonio Marshall  Procedure(s) Performed: Procedure(s): INTRAMEDULLARY (IM) NAIL FEMORAL (Left)  Patient Location: PACU  Anesthesia Type:General  Level of Consciousness: awake, oriented, sedated and patient cooperative  Airway and Oxygen Therapy: Patient Spontanous Breathing  Post-op Pain: mild  Post-op Assessment: Post-op Vital signs reviewed, Patient's Cardiovascular Status Stable, Respiratory Function Stable, Patent Airway, No signs of Nausea or vomiting and Pain level controlled  Post-op Vital Signs: stable  Complications: No apparent anesthesia complications

## 2013-02-14 NOTE — Progress Notes (Signed)
Utilization review completed.  

## 2013-02-15 DIAGNOSIS — J449 Chronic obstructive pulmonary disease, unspecified: Secondary | ICD-10-CM

## 2013-02-15 DIAGNOSIS — E7800 Pure hypercholesterolemia, unspecified: Secondary | ICD-10-CM

## 2013-02-15 LAB — VITAMIN B12: Vitamin B-12: 435 pg/mL (ref 211–911)

## 2013-02-15 LAB — BASIC METABOLIC PANEL
BUN: 32 mg/dL — ABNORMAL HIGH (ref 6–23)
CALCIUM: 8.6 mg/dL (ref 8.4–10.5)
CO2: 22 mEq/L (ref 19–32)
Chloride: 109 mEq/L (ref 96–112)
Creatinine, Ser: 0.96 mg/dL (ref 0.50–1.35)
GFR calc Af Amer: >90 mL/min (ref >90)
GFR, EST NON AFRICAN AMERICAN: 80 mL/min — AB (ref >90)
GLUCOSE: 183 mg/dL — AB (ref 70–99)
Potassium: 4.8 mEq/L (ref 3.7–5.3)
Sodium: 146 mEq/L (ref 137–147)

## 2013-02-15 LAB — IRON AND TIBC
IRON: 13 ug/dL — AB (ref 42–135)
SATURATION RATIOS: 10 % — AB (ref 20–55)
TIBC: 124 ug/dL — ABNORMAL LOW (ref 215–435)
UIBC: 111 ug/dL — AB (ref 125–400)

## 2013-02-15 LAB — RETICULOCYTES
RBC.: 3.13 MIL/uL — ABNORMAL LOW (ref 4.22–5.81)
Retic Count, Absolute: 37.6 10*3/uL (ref 19.0–186.0)
Retic Ct Pct: 1.2 % (ref 0.4–3.1)

## 2013-02-15 LAB — CBC
HEMATOCRIT: 27.6 % — AB (ref 39.0–52.0)
HEMOGLOBIN: 8.8 g/dL — AB (ref 13.0–17.0)
MCH: 28.1 pg (ref 26.0–34.0)
MCHC: 31.9 g/dL (ref 30.0–36.0)
MCV: 88.2 fL (ref 78.0–100.0)
Platelets: 304 10*3/uL (ref 150–400)
RBC: 3.13 MIL/uL — ABNORMAL LOW (ref 4.22–5.81)
RDW: 14.9 % (ref 11.5–15.5)
WBC: 11 10*3/uL — ABNORMAL HIGH (ref 4.0–10.5)

## 2013-02-15 LAB — FOLATE

## 2013-02-15 LAB — FERRITIN: Ferritin: 1838 ng/mL — ABNORMAL HIGH (ref 22–322)

## 2013-02-15 MED ORDER — PNEUMOCOCCAL VAC POLYVALENT 25 MCG/0.5ML IJ INJ
0.5000 mL | INJECTION | INTRAMUSCULAR | Status: DC
  Filled 2013-02-15: qty 0.5

## 2013-02-15 MED ORDER — INFLUENZA VAC SPLIT QUAD 0.5 ML IM SUSP
0.5000 mL | INTRAMUSCULAR | Status: AC
  Administered 2013-02-17: 0.5 mL via INTRAMUSCULAR
  Filled 2013-02-15: qty 0.5

## 2013-02-15 MED ORDER — ALPRAZOLAM 0.25 MG PO TABS
0.2500 mg | ORAL_TABLET | Freq: Two times a day (BID) | ORAL | Status: DC | PRN
  Administered 2013-02-15 – 2013-02-19 (×4): 0.25 mg via ORAL
  Filled 2013-02-15 (×4): qty 1

## 2013-02-15 NOTE — Progress Notes (Signed)
Occupational Therapy Evaluation Patient Details Name: Antonio Marshall MRN: [id number] DOB: 1940/10/13 Today's Date: 02/15/2013 Time: 7517-0017 OT Time Calculation (min): 16 min  OT Assessment / Plan / Recommendation History of present illness Pt is a 73 y/o male admitted s/p left femoral IM nailing after a fall.    Clinical Impression   PTA, pt apparently lived at [facility]. Currently, pt max assist with all mobility and ADL. Pt extremely high risk for falls. Has chair alarm, but recommended for pt to be moved to Camera room - dicussed with nsg. All further OT to be addressed at SNF.    OT Assessment  All further OT needs can be met in the next venue of care    Follow Up Recommendations  SNF    Barriers to Discharge      Equipment Recommendations  None recommended by OT    Recommendations for Other Services    Frequency       Precautions / Restrictions Precautions Precautions: Fall Restrictions Weight Bearing Restrictions: Yes LLE Weight Bearing: Weight bearing as tolerated   Pertinent Vitals/Pain C/o back pain Vitals stable    ADL  Eating/Feeding: Set up Where Assessed - Eating/Feeding: Chair Grooming: Minimal assistance Where Assessed - Grooming: Supported sitting Transfers/Ambulation Related to ADLs: +2 max A ADL Comments: Max A with all other ADL tasks    OT Diagnosis: Generalized weakness;Cognitive deficits;Disturbance of vision;Acute pain;Hemiplegia non-dominant side;Altered mental status  OT Problem List: Decreased strength;Decreased range of motion;Decreased activity tolerance;Impaired balance (sitting and/or standing);Impaired vision/perception;Decreased coordination;Decreased cognition;Decreased safety awareness;Decreased knowledge of use of DME or AE;Decreased knowledge of precautions;Impaired sensation;Impaired tone;Impaired UE functional use;Pain OT Treatment Interventions:     OT Goals(Current goals can be found in the care plan section) Acute Rehab  OT Goals Patient Stated Goal: none stated OT Goal Formulation: Patient unable to participate in goal setting  Visit Information  Last OT Received On: 02/15/13 Assistance Needed: +1 History of Present Illness: Pt is a 73 y/o male admitted s/p left femoral IM nailing after a fall.        Prior [place] expects to be discharged to:: Skilled nursing facility Additional Comments: "I'm gong back to the other hospital." Prior Function Level of Independence: Needs assistance Gait / Transfers Assistance Needed: need assistance Comments: Apparently pt has lived at [facility] since December. Pt unable to give history himself Communication Communication: No difficulties Dominant Hand: Right         Vision/Perception Vision - History Baseline Vision: Other (comment) Patient Visual Report: Other (comment);Blurring of vision Vision - Assessment Eye Alignment: Within Functional Limits Additional Comments: R gaze preference   Cognition  Cognition Arousal/Alertness: Awake/alert Behavior During Therapy: Restless;Anxious;Impulsive;Agitated Overall Cognitive Status: No family/caregiver present to determine baseline cognitive functioning Cognition impaired - pt thinks he is in [place] becuase he hurt his back.    Extremity/Trunk Assessment Upper Extremity Assessment Upper Extremity Assessment: LUE deficits/detail LUE Deficits / Details: apparentl L UE hemiparesis. L UE hand contracture Lower Extremity Assessment Lower Extremity Assessment: LLE deficits/detail LLE Deficits / Details: Decreased strength and AROM consistent with femoral IM nailing LLE: Unable to fully assess due to pain Cervical / Trunk Assessment Cervical / Trunk Assessment: Other exceptions Cervical / Trunk Exceptions: lat trunk shortening. residual fro apparnt CVA     Mobility Bed Mobility Overal bed mobility: Needs Assistance;+2 for physical assistance Bed Mobility: Supine to  Sit Supine to sit: Mod assist;+2 for physical assistance General bed mobility comments: VC's for  sequencing and technique. Pt did not make corrective changes with cueing, and most assist was for trunk support as pt was coming to full sit on EOB.  Transfers Overall transfer level: Needs assistance Equipment used: Rolling walker (2 wheeled) Transfers: Sit to/from Stand Sit to Stand: Mod assist;+2 physical assistance Stand pivot transfers: Max assist;+2 physical assistance General transfer comment: pt refuses to use RW     Exercise General Exercises - Lower Extremity Ankle Circles/Pumps: 10 reps Quad Sets: 10 reps   Balance Balance Overall balance assessment: Needs assistance Sitting-balance support: Feet supported;No upper extremity supported Sitting balance-Leahy Scale: Poor Sitting balance - Comments: Refused to sit up straight on EOB, required mod-max assist to maintain sitting balance. Wanted to "sit like he does at home." Postural control: Right lateral lean;Posterior lean Standing balance support: Single extremity supported Standing balance-Leahy Scale: Poor Standing balance comment: Unable to hold walker with LUE. With RUE on walker or with HHA, pt required mod assist to maintain balance.  General Comments General comments (skin integrity, edema, etc.): L UE contracture. Smells of urine   End of Session OT - End of Session Activity Tolerance: Patient tolerated treatment well Patient left: in chair;with call bell/phone within reach;with chair alarm set Nurse Communication: Other (comment);Mobility status (need for pt to be moved to camera room as pt is very high fa)  GO     Antonio Marshall,HILLARY 02/15/2013, 12:22 PM [facility], OTR/L  [phone] 02/15/2013

## 2013-02-15 NOTE — Progress Notes (Signed)
Dr. Catha GosselinMikhail aware of pt pulling out IV, ordered not to attempt another IV insertion at this time d/t not needing IV fluids.

## 2013-02-15 NOTE — Progress Notes (Addendum)
[facility]                                                                              Patient Demographics  Antonio Marshall, is a 73 y.o. male, DOB - 1940-04-03, WUJ:[id number]  Admit date - 02/13/2013   Admitting Physician Henderson Cloud, MD  Outpatient Primary MD for the patient is Michele Mcalpine, MD  LOS - 2   Chief Complaint  Patient presents with  . Hip Injury        Assessment & Plan   Left intertrochanteric hip fracture  -POD 1 from intramedullary Nail femoral left by Dr. 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  -TSH 0.676, B12 366, Folate >20, RPR negative  Prior history of Alcohol abuse  -According to family, patient has been sober for over one year -Will continue thiamine, multivitamin, and folate.  Acute kidney Injury -Resolved, Cr improving 0.96, likely secondary to dehyration  Anemia, normacytic -H/H 8.8/7.6, baseline Hb 11 -Anemia panel pending -Will continue to monitor and transfuse if Hb <7  COPD -appears compensated -Continue dulera -will order incentive spirometry  Code Status: DNR  Family Communication: None at bedside  Disposition Plan: Admitted  Time Spent in minutes   30 minutes  Procedures  Left intramedullary femoral nail  Consults   Orthopedic surgery  DVT Prophylaxis  Heparin   Lab Results  Component Value Date   PLT 304 02/15/2013    Medications  Scheduled Meds: . aspirin EC  325 mg Oral Q breakfast  . folic acid  1 mg Oral Daily  . heparin  5,000 Units Subcutaneous Q8H  . methylPREDNISolone  2 mg Oral Daily  . mometasone-formoterol  2 puff Inhalation BID  . multivitamin with minerals  1 tablet Oral Daily  . sertraline  100 mg Oral Daily  . thiamine  100 mg Oral Daily   Continuous Infusions: . sodium chloride 100 mL/hr at 02/14/13 0500  .  dextrose 5 % and 0.45% NaCl 50 mL/hr at 02/14/13 2202  . lactated ringers    . lactated ringers 50 mL/hr at 02/14/13 1540   PRN Meds:.acetaminophen, acetaminophen, albuterol, fentaNYL, HYDROcodone-acetaminophen, menthol-cetylpyridinium, methocarbamol (ROBAXIN) IV, methocarbamol, metoCLOPramide (REGLAN) injection, metoCLOPramide, morphine injection, ondansetron (ZOFRAN) IV, ondansetron, phenol, senna-docusate  Antibiotics    Anti-infectives   Start     Dose/Rate Route Frequency Ordered Stop   02/14/13 2200  ceFAZolin (ANCEF) IVPB 2 g/50 mL premix     2 g 100 mL/hr over 30 Minutes Intravenous Every 6 hours 02/14/13 1936 02/15/13 0406   02/14/13 0600  ceFAZolin (ANCEF) IVPB 2 g/50 mL premix  Status:  Discontinued     2 g 100 mL/hr over 30 Minutes Intravenous On call to O.R. 02/14/13 0449 02/14/13 1829        Subjective:   Antonio Marshall seen and examined today.  Denies any pain at this time.  Appears to be comfortable.  Asks for his breakfast.  Objective:   Filed Vitals:   02/15/13 0000 02/15/13 0200 02/15/13 0431 02/15/13 0540  BP:  110/58  101/53  Pulse:  85  73  Temp:  98.2 F (36.8 C)  98.4 F (36.9 C)  TempSrc:  Oral  Oral  Resp: 20 19  19   SpO2: 94% 92% 95% 92%    Wt Readings from Last 3 Encounters:  01/14/13 61.145 kg (134 lb 12.8 oz)  12/13/12 60 kg (132 lb 4.4 oz)  06/25/12 56.065 kg (123 lb 9.6 oz)     Intake/Output Summary (Last 24 hours) at 02/15/13 0717 Last data filed at 02/15/13 0650  Gross per 24 hour  Intake   2000 ml  Output   1730 ml  Net    270 ml    Exam  General: Well developed, malnourished, NAD, appears stated age  HEENT: NCAT, PERRLA, EOMI, Anicteic Sclera, mucous membranes moist.   Neck: Supple, no JVD, no masses  Cardiovascular: S1 S2 auscultated, no rubs, murmurs or gallops. Regular rate and rhythm.  Respiratory: Scattered rhonchi  Abdomen: Soft, nontender, nondistended, + bowel sounds  Extremities: warm dry without cyanosis  clubbing or edema. Bandage noted on left hip  Neuro: AAOx3, cranial nerves grossly intact.   Skin: Without rashes exudates or nodules   Data Review   Micro Results No results found for this or any previous visit (from the past 240 hour(s)).  Radiology Reports Dg Chest 1 View  02/13/2013   CLINICAL DATA:  Pain post trauma  EXAM: CHEST - 1 VIEW  COMPARISON:  January 14, 2013  FINDINGS: Extensive fibrotic change throughout the mid and lower lung zones remains. There is milder scarring in the upper lobes bilaterally. There is no frank edema or consolidation. Heart is upper normal in size with normal pulmonary vascularity. No adenopathy. There is atherosclerotic change in aorta. No pneumothorax. 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 No acute fracture seen.  IMPRESSION: Extensive fibrotic change in mid lower lung zones bilaterally. No frank edema or consolidation.   Electronically Signed   By: Bretta BangWilliam  Woodruff M.D.   On: 02/13/2013 11:02   Dg Hip Complete Left  02/13/2013   CLINICAL DATA:  Pain post trauma  EXAM: LEFT HIP - COMPLETE 2+ VIEW  COMPARISON:  None.  FINDINGS: Frontal pelvis as well as frontal and lateral left hip images were obtained. There is a comminuted intertrochanteric femur fracture on the left with varus angulation at the fracture site. The lesser trochanter is avulsed medially. No other fractures. No dislocation. There is mild symmetric narrowing of both hip joints.  IMPRESSION: Comminuted intertrochanteric femur fracture on the left with varus angulation at the fracture site.   Electronically Signed   By: Bretta BangWilliam  Woodruff M.D.   On: 02/13/2013 11:01    CBC  Recent Labs Lab 02/13/13 1222 02/14/13 0410 02/15/13 0410  WBC 11.8* 11.7* 11.0*  HGB 10.1* 9.1* 8.8*  HCT 29.5* 27.9* 27.6*  PLT 302 312 304  MCV 83.6 85.3 88.2  MCH 28.6 27.8 28.1  MCHC 34.2 32.6 31.9  RDW 14.5 14.6 14.9  LYMPHSABS 1.4  --   --    MONOABS 1.1*  --   --   EOSABS 0.0  --   --   BASOSABS 0.0  --   --     Chemistries   Recent Labs Lab 02/13/13 1222 02/14/13 0410 02/15/13 0410  NA 142 144 146  K 5.0 4.5 4.8  CL 103 107 109  CO2 20 20 22   GLUCOSE 102* 115* 183*  BUN 61* 51* 32*  CREATININE  1.45* 1.26 0.96  CALCIUM 9.3 8.8 8.6   ------------------------------------------------------------------------------------------------------------------ CrCl is unknown because both a height and weight (above a minimum accepted value) are required for this calculation. ------------------------------------------------------------------------------------------------------------------ No results found for this basename: HGBA1C,  in the last 72 hours ------------------------------------------------------------------------------------------------------------------ No results found for this basename: CHOL, HDL, LDLCALC, TRIG, CHOLHDL, LDLDIRECT,  in the last 72 hours ------------------------------------------------------------------------------------------------------------------  Recent Labs  02/13/13 2020  TSH 0.676   ------------------------------------------------------------------------------------------------------------------  Recent Labs  02/13/13 2020 02/15/13 0410  VITAMINB12 366  --   RETICCTPCT  --  1.2    Coagulation profile  Recent Labs Lab 02/13/13 1222  INR 1.29    No results found for this basename: DDIMER,  in the last 72 hours  Cardiac Enzymes No results found for this basename: CK, CKMB, TROPONINI, MYOGLOBIN,  in the last 168 hours ------------------------------------------------------------------------------------------------------------------ No components found with this basename: POCBNP,     Daevion Navarette D.O. on 02/15/2013 at 7:17 AM  Between 7am to 7pm - Pager - [phone]  After 7pm go to [email or web address] - password [email or web address]  And look for the night coverage person covering for  me after hours  [facility] Office  [phone]

## 2013-02-15 NOTE — Evaluation (Signed)
Physical Therapy Evaluation Patient Details Name: Antonio Marshall L Alkema MRN: [id number] DOB: January 13, 1941 Today's Date: 02/15/2013 Time: [id number] PT Time Calculation (min): 27 min  PT Assessment / Plan / Recommendation History of Present Illness  Pt is a 73 y/o male admitted s/p left femoral IM nailing after a fall.   Clinical Impression  This patient presents with acute pain and decreased functional independence following the above mentioned procedure. At the time of PT eval, pt had difficulty focusing on therapy tasks, and refused to use the walker for support. +2 max assist was required for SPT from bed to chair. Pt is a poor historian and unable to provide information regarding PLOF or living situation. Strongly feel that pt needs 24 hour supervision and continued skilled therapy prior to returning to a home situation for safety.      PT Assessment  Patient needs continued PT services    Follow Up Recommendations  SNF    Does the patient have the potential to tolerate intense rehabilitation      Barriers to Discharge Other (comment) (Reliable information not able to be obtained during session.)      Equipment Recommendations  Other (comment) (TBD by next venue of care)    Recommendations for Other Services     Frequency Min 3X/week    Precautions / Restrictions Precautions Precautions: Fall Restrictions Weight Bearing Restrictions: Yes LLE Weight Bearing: Weight bearing as tolerated   Pertinent Vitals/Pain Pt reports he has pain in his LLE, however is unable to rate on a 0-10 scale when asked.       Mobility  Bed Mobility Overal bed mobility: Needs Assistance;+2 for physical assistance Bed Mobility: Supine to Sit Supine to sit: Mod assist;+2 for physical assistance General bed mobility comments: VC's for sequencing and technique. Pt did not make corrective changes with cueing, and most assist was for trunk support as pt was coming to full sit on EOB.  Transfers Overall  transfer level: Needs assistance Equipment used: Rolling walker (2 wheeled) Transfers: Sit to/from UGI CorporationStand;Stand Pivot Transfers Sit to Stand: Mod assist;+2 physical assistance Stand pivot transfers: Max assist;+2 physical assistance General transfer comment: Tried transfers with RW, however pt was unable to hold walker handle with LUE, and pushed walker away stating "I don't need this". Was able to perform SPT from bed to chair with +2 max assist. Ambulation/Gait General Gait Details: NT    Exercises General Exercises - Lower Extremity Ankle Circles/Pumps: 10 reps Quad Sets: 10 reps   PT Diagnosis: Difficulty walking;Acute pain  PT Problem List: Decreased strength;Decreased range of motion;Decreased activity tolerance;Decreased balance;Decreased mobility;Decreased knowledge of use of DME;Decreased safety awareness;Pain PT Treatment Interventions: DME instruction;Gait training;Stair training;Functional mobility training;Therapeutic activities;Therapeutic exercise;Neuromuscular re-education;Patient/family education     PT Goals(Current goals can be found in the care plan section) Acute Rehab PT Goals Patient Stated Goal: To obtain alcohol here in the hospital PT Goal Formulation: With patient Time For Goal Achievement: 03/01/13 Potential to Achieve Goals: Fair  Visit Information  Last PT Received On: 02/15/13 Assistance Needed: +1 History of Present Illness: Pt is a 73 y/o male admitted s/p left femoral IM nailing after a fall.        Prior Functioning  Home Living Family/patient expects to be discharged to:: Skilled nursing facility Additional Comments: "I'm gong back to the other hospital." Prior Function Level of Independence: Independent Comments: Pt unable to provide info correctly.  Contradicts himself often. No family present to report PLOF.  Communication Communication: No difficulties Dominant Hand:  Right    Cognition  Cognition Arousal/Alertness:  Awake/alert Behavior During Therapy: Restless;Anxious;Impulsive Overall Cognitive Status: No family/caregiver present to determine baseline cognitive functioning    Extremity/Trunk Assessment Upper Extremity Assessment Upper Extremity Assessment: LUE deficits/detail LUE Deficits / Details: Decreased strength and ROM. Per chart review, pt has "partial paralysis" of the LUE, did not see a cause.  Lower Extremity Assessment Lower Extremity Assessment: LLE deficits/detail LLE Deficits / Details: Decreased strength and AROM consistent with femoral IM nailing LLE: Unable to fully assess due to pain Cervical / Trunk Assessment Cervical / Trunk Assessment: Normal   Balance Balance Overall balance assessment: Needs assistance Sitting-balance support: Feet supported;No upper extremity supported Sitting balance-Leahy Scale: Poor Sitting balance - Comments: Refused to sit up straight on EOB, required mod-max assist to maintain sitting balance. Wanted to "sit like he does at home." Postural control: Right lateral lean;Posterior lean Standing balance support: Single extremity supported Standing balance-Leahy Scale: Poor Standing balance comment: Unable to hold walker with LUE. With RUE on walker or with HHA, pt required mod assist to maintain balance.   End of Session PT - End of Session Equipment Utilized During Treatment: Gait belt;Oxygen Activity Tolerance: Patient limited by pain Patient left: in chair;with call bell/phone within reach Nurse Communication: Mobility status  GP     Ruthann Cancer 02/15/2013, 11:26 AM  Ruthann Cancer, PT, DPT [phone]

## 2013-02-15 NOTE — Progress Notes (Signed)
Orthopedic Tech Progress Note Patient Details:  Antonio OsgoodWarren L Marshall 1940/05/29 [id number]  Patient ID: Antonio OsgoodWarren L Marshall, male   DOB: 1940/05/29, 73 y.o.   MRN: [id number] Pt refused OHF. Frame not applied.  Reynold Mantell T 02/15/2013, 1:37 PM

## 2013-02-15 NOTE — Progress Notes (Signed)
Subjective: 1 Day Post-Op Procedure(s) (LRB): INTRAMEDULLARY (IM) NAIL FEMORAL (Left) Patient reports pain as 3 on 0-10 scale.    Patient appears somewhat disoriented and just pulled IV out of hand.  Denies any nausea/vomiting.  Positive flatus but no bm as of yet.  Objective: Vital signs in last 24 hours: Temp:  [97.7 F (36.5 C)-98.4 F (36.9 C)] 98.4 F (36.9 C) (01/17 0540) Pulse Rate:  [67-85] 73 (01/17 0540) Resp:  [10-23] 20 (01/17 0820) BP: (91-119)/(39-58) 101/53 mmHg (01/17 0540) SpO2:  [89 %-96 %] 93 % (01/17 0900) FiO2 (%):  [32 %-36 %] 36 % (01/17 0431)  Intake/Output from previous day: 01/16 0701 - 01/17 0700 In: 2000 [P.O.:600; I.V.:1400] Out: 1730 [Urine:1680; Blood:50] Intake/Output this shift: Total I/O In: 240 [P.O.:240] Out: -    Recent Labs  02/13/13 1222 02/14/13 0410 02/15/13 0410  HGB 10.1* 9.1* 8.8*    Recent Labs  02/14/13 0410 02/15/13 0410  WBC 11.7* 11.0*  RBC 3.27* 3.13*  3.13*  HCT 27.9* 27.6*  PLT 312 304    Recent Labs  02/14/13 0410 02/15/13 0410  NA 144 146  K 4.5 4.8  CL 107 109  CO2 20 22  BUN 51* 32*  CREATININE 1.26 0.96  GLUCOSE 115* 183*  CALCIUM 8.8 8.6    Recent Labs  02/13/13 1222  INR 1.29    Neurologically intact ABD soft Neurovascular intact Sensation intact distally Intact pulses distally Dorsiflexion/Plantar flexion intact Compartment soft Alert and oriented to person and place  Assessment/Plan: 1 Day Post-Op Procedure(s) (LRB): INTRAMEDULLARY (IM) NAIL FEMORAL (Left) Advance diet Up with therapy Follow plan per medicine  ANTON, M. LINDSEY 02/15/2013, 11:20 AM

## 2013-02-16 LAB — CBC
HEMATOCRIT: 25.6 % — AB (ref 39.0–52.0)
Hemoglobin: 8.3 g/dL — ABNORMAL LOW (ref 13.0–17.0)
MCH: 27.8 pg (ref 26.0–34.0)
MCHC: 32.4 g/dL (ref 30.0–36.0)
MCV: 85.6 fL (ref 78.0–100.0)
Platelets: 321 10*3/uL (ref 150–400)
RBC: 2.99 MIL/uL — ABNORMAL LOW (ref 4.22–5.81)
RDW: 14.7 % (ref 11.5–15.5)
WBC: 12.5 10*3/uL — AB (ref 4.0–10.5)

## 2013-02-16 LAB — HEPATIC FUNCTION PANEL
ALT: 40 U/L (ref 0–53)
AST: 65 U/L — ABNORMAL HIGH (ref 0–37)
Albumin: 1.9 g/dL — ABNORMAL LOW (ref 3.5–5.2)
Alkaline Phosphatase: 43 U/L (ref 39–117)
BILIRUBIN INDIRECT: 0.3 mg/dL (ref 0.3–0.9)
Bilirubin, Direct: 0.2 mg/dL (ref 0.0–0.3)
TOTAL PROTEIN: 6.5 g/dL (ref 6.0–8.3)
Total Bilirubin: 0.5 mg/dL (ref 0.3–1.2)

## 2013-02-16 NOTE — Progress Notes (Addendum)
[facility]                                                                              Patient Demographics  Antonio Marshall, is a 73 y.o. male, DOB - 01/11/1941, ZOX:[id number]  Admit date - 02/13/2013   Admitting Physician Henderson CloudEstela Y Hernandez Acosta, MD  Outpatient Primary MD for the patient is Michele McalpineNADEL,SCOTT M, MD  LOS - 3   Chief Complaint  Patient presents with  . Hip Injury        Assessment & Plan   Left intertrochanteric hip fracture  -POD 2 from intramedullary Nail femoral left by Dr. Eulah PontMurphy -Ortho following -Continue pain control -Continue PT/OT  Frequent falls  -Suspect a large component of this is related to his severe cognitive impairment from chronic alcohol abuse.  -TSH 0.676, B12 366, Folate >20, RPR negative -Needs continued PT  Prior history of Alcohol abuse  -According to family, patient has been sober for over one year -Will continue thiamine, multivitamin, and folate.  Acute kidney Injury -Resolved, Cr improving 0.96, likely secondary to dehyration  Anemia, normacytic -H/H 8.3/25.6, baseline Hb 11 -Anemia panel: Iron 13, ferritin 1838, will obtain liver function panel to assess for liver failure, ?hemachromotosis -Will continue to monitor and transfuse if Hb <7  COPD -appears compensated -Continue dulera -will order incentive spirometry  History of head trauma and anxiety -Will continue xanax  Code Status: DNR  Family Communication: None at bedside  Disposition Plan: Admitted  Time Spent in minutes   30 minutes  Procedures  Left intramedullary femoral nail  Consults   Orthopedic surgery  DVT Prophylaxis  Heparin   Lab Results  Component Value Date   PLT 304 02/15/2013    Medications  Scheduled Meds: . aspirin EC  325 mg Oral Q breakfast  . folic acid  1 mg Oral Daily  . influenza vac split quadrivalent PF  0.5 mL Intramuscular Tomorrow-1000  . methylPREDNISolone  2 mg Oral Daily  . mometasone-formoterol  2 puff  Inhalation BID  . multivitamin with minerals  1 tablet Oral Daily  . pneumococcal 23 valent vaccine  0.5 mL Intramuscular Tomorrow-1000  . sertraline  100 mg Oral Daily  . thiamine  100 mg Oral Daily   Continuous Infusions: . sodium chloride 100 mL/hr at 02/14/13 0500  . lactated ringers    . lactated ringers 50 mL/hr at 02/14/13 1540   PRN Meds:.acetaminophen, acetaminophen, albuterol, ALPRAZolam, fentaNYL, HYDROcodone-acetaminophen, menthol-cetylpyridinium, methocarbamol (ROBAXIN) IV, methocarbamol, metoCLOPramide (REGLAN) injection, metoCLOPramide, morphine injection, ondansetron (ZOFRAN) IV, ondansetron, phenol, senna-docusate  Antibiotics    Anti-infectives   Start     Dose/Rate Route Frequency Ordered Stop   02/14/13 2200  ceFAZolin (ANCEF) IVPB 2 g/50 mL premix     2 g 100 mL/hr over 30 Minutes Intravenous Every 6 hours 02/14/13 1936 02/15/13 0406   02/14/13 0600  ceFAZolin (ANCEF) IVPB 2 g/50 mL premix  Status:  Discontinued     2 g 100 mL/hr over 30 Minutes Intravenous On call to O.R. 02/14/13 0449 02/14/13 1829        Subjective:   Antonio Marshall seen and examined today.  Denies any pain at this time.  Appears  to be comfortable.  No complaints at this time.  Objective:   Filed Vitals:   02/15/13 1600 02/15/13 2057 02/15/13 2100 02/16/13 0638  BP:   120/62 111/53  Pulse:   88 76  Temp:   97.6 F (36.4 C) 97 F (36.1 C)  TempSrc:      Resp: 18  20 18   SpO2: 95% 90% 90% 91%    Wt Readings from Last 3 Encounters:  01/14/13 61.145 kg (134 lb 12.8 oz)  12/13/12 60 kg (132 lb 4.4 oz)  06/25/12 56.065 kg (123 lb 9.6 oz)     Intake/Output Summary (Last 24 hours) at 02/16/13 0754 Last data filed at 02/15/13 2330  Gross per 24 hour  Intake 1386.67 ml  Output      0 ml  Net 1386.67 ml    Exam  General: Well developed, malnourished, NAD, appears stated age  HEENT: NCAT, PERRLA, EOMI, Anicteic Sclera, mucous membranes moist.   Neck: Supple, no JVD, no  masses  Cardiovascular: S1 S2 auscultated, no rubs, murmurs or gallops. Regular rate and rhythm.  Respiratory: Scattered rhonchi  Abdomen: Soft, nontender, nondistended, + bowel sounds  Extremities: warm dry without cyanosis clubbing or edema. Bandage noted on left hip  Neuro: AAOx3, cranial nerves grossly intact.   Skin: Without rashes exudates or nodules   Data Review   Micro Results No results found for this or any previous visit (from the past 240 hour(s)).  Radiology Reports Dg Chest 1 View  02/13/2013   CLINICAL DATA:  Pain post trauma  EXAM: CHEST - 1 VIEW  COMPARISON:  January 14, 2013  FINDINGS: Extensive fibrotic change throughout the mid and lower lung zones remains. There is milder scarring in the upper lobes bilaterally. There is no frank edema or consolidation. Heart is upper normal in size with normal pulmonary vascularity. No adenopathy. There is atherosclerotic change in aorta. No pneumothorax. 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 No acute fracture seen.  IMPRESSION: Extensive fibrotic change in mid lower lung zones bilaterally. No frank edema or consolidation.   Electronically Signed   By: Bretta Bang M.D.   On: 02/13/2013 11:02   Dg Hip Complete Left  02/13/2013   CLINICAL DATA:  Pain post trauma  EXAM: LEFT HIP - COMPLETE 2+ VIEW  COMPARISON:  None.  FINDINGS: Frontal pelvis as well as frontal and lateral left hip images were obtained. There is a comminuted intertrochanteric femur fracture on the left with varus angulation at the fracture site. The lesser trochanter is avulsed medially. No other fractures. No dislocation. There is mild symmetric narrowing of both hip joints.  IMPRESSION: Comminuted intertrochanteric femur fracture on the left with varus angulation at the fracture site.   Electronically Signed   By: Bretta Bang M.D.   On: 02/13/2013 11:01    CBC  Recent Labs Lab 02/13/13 1222  02/14/13 0410 02/15/13 0410  WBC 11.8* 11.7* 11.0*  HGB 10.1* 9.1* 8.8*  HCT 29.5* 27.9* 27.6*  PLT 302 312 304  MCV 83.6 85.3 88.2  MCH 28.6 27.8 28.1  MCHC 34.2 32.6 31.9  RDW 14.5 14.6 14.9  LYMPHSABS 1.4  --   --   MONOABS 1.1*  --   --   EOSABS 0.0  --   --   BASOSABS 0.0  --   --     Chemistries   Recent Labs Lab 02/13/13 1222 02/14/13 0410 02/15/13 0410  NA 142 144  146  K 5.0 4.5 4.8  CL 103 107 109  CO2 20 20 22   GLUCOSE 102* 115* 183*  BUN 61* 51* 32*  CREATININE 1.45* 1.26 0.96  CALCIUM 9.3 8.8 8.6   ------------------------------------------------------------------------------------------------------------------ CrCl is unknown because both a height and weight (above a minimum accepted value) are required for this calculation. ------------------------------------------------------------------------------------------------------------------ No results found for this basename: HGBA1C,  in the last 72 hours ------------------------------------------------------------------------------------------------------------------ No results found for this basename: CHOL, HDL, LDLCALC, TRIG, CHOLHDL, LDLDIRECT,  in the last 72 hours ------------------------------------------------------------------------------------------------------------------  Recent Labs  02/13/13 2020  TSH 0.676   ------------------------------------------------------------------------------------------------------------------  Recent Labs  02/13/13 2020 02/15/13 0410  VITAMINB12 366 435  FOLATE  --  >20.0  FERRITIN  --  1838*  TIBC  --  124*  IRON  --  13*  RETICCTPCT  --  1.2    Coagulation profile  Recent Labs Lab 02/13/13 1222  INR 1.29    No results found for this basename: DDIMER,  in the last 72 hours  Cardiac Enzymes No results found for this basename: CK, CKMB, TROPONINI, MYOGLOBIN,  in the last 168  hours ------------------------------------------------------------------------------------------------------------------ No components found with this basename: POCBNP,     Lynden Flemmer D.O. on 02/16/2013 at 7:54 AM  Between 7am to 7pm - Pager - [phone]  After 7pm go to [email or web address] - password [email or web address]  And look for the night coverage person covering for me after hours  [facility] Office  [phone]

## 2013-02-16 NOTE — Progress Notes (Signed)
Clinical Social Work Department BRIEF PSYCHOSOCIAL ASSESSMENT 02/16/2013  Patient:  Antonio Marshall,Son L     Account Number:  [id number]     Admit date:  02/13/2013  Clinical Social Worker:  Hendricks MiloMORGAN,Cheris Tweten, LCSWA  Date/Time:  02/16/2013 11:32 AM  Referred by:  Physician  Date Referred:  02/14/2013 Referred for  SNF Placement   Other Referral:   Interview type:  Family Other interview type:    PSYCHOSOCIAL DATA Living Status:  FACILITY [facility] Level of care:  Skilled Nursing Facility Primary support name:  Antonio Marshall [phone] (home phone) Primary support relationship to patient:  CHILD, ADULT Degree of support available:   Very supportive, completed assessment with CSW over the phone.    CURRENT CONCERNS  Other Concerns:    SOCIAL WORK ASSESSMENT / PLAN Clinical Social Worker (CSW) contacted patient's daughter Antonio Marshall because per chart patient is not alert and oriented. Antonio Marshall reported that [facility]. Antonio Marshall stated that she would like her father to return to [facility]. Antonio Marshall is working on Biomedical scientistsecuring long term medicaid for the patient. CSW encouraged Antonio Marshall to print [facility] application online and start it. Antonio Marshall verbalized her understanding.   Assessment/plan status:  Psychosocial Support/Ongoing Assessment of Needs Other assessment/ plan:   Information/referral to community resources:   CSW informed daughter that she can print a Personal assistantmedicaid application and [place] at [place] Department of [place].    PATIENT'S/FAMILY'S RESPONSE TO PLAN OF CARE: Antonio Marshall thanked CSW for calling and helping with the long term placement process.

## 2013-02-16 NOTE — Progress Notes (Signed)
Subjective: 2 Days Post-Op Procedure(s) (LRB): INTRAMEDULLARY (IM) NAIL FEMORAL (Left) Patient reports pain as 3 on 0-10 scale.  Patient appears less agitated today.  However, he has been in restraints.  Positive flatus and bm.  No nausea/vomiting.    Objective: Vital signs in last 24 hours: Temp:  [97 F (36.1 C)-98 F (36.7 C)] 97 F (36.1 C) (01/18 [id number]) Pulse Rate:  [76-89] 76 (01/18 0638) Resp:  [16-20] 16 (01/18 0820) BP: (100-120)/(48-62) 111/53 mmHg (01/18 0638) SpO2:  [90 %-95 %] 93 % (01/18 0820)  Intake/Output from previous day: 01/17 0701 - 01/18 0700 In: 1626.7 [P.O.:960; I.V.:666.7] Out: -  Intake/Output this shift:     Recent Labs  02/13/13 1222 02/14/13 0410 02/15/13 0410 02/16/13 0500  HGB 10.1* 9.1* 8.8* 8.3*    Recent Labs  02/15/13 0410 02/16/13 0500  WBC 11.0* 12.5*  RBC 3.13*  3.13* 2.99*  HCT 27.6* 25.6*  PLT 304 321    Recent Labs  02/14/13 0410 02/15/13 0410  NA 144 146  K 4.5 4.8  CL 107 109  CO2 20 22  BUN 51* 32*  CREATININE 1.26 0.96  GLUCOSE 115* 183*  CALCIUM 8.8 8.6    Recent Labs  02/13/13 1222  INR 1.29    Neurologically intact ABD soft Neurovascular intact Sensation intact distally Intact pulses distally Dorsiflexion/Plantar flexion intact Compartment soft  Assessment/Plan: 2 Days Post-Op Procedure(s) (LRB): INTRAMEDULLARY (IM) NAIL FEMORAL (Left) Advance diet Up with therapy Weight bearing as tolerated Continue plan per medicine  ANTON, M. LINDSEY 02/16/2013, 11:29 AM

## 2013-02-17 ENCOUNTER — Inpatient Hospital Stay (HOSPITAL_COMMUNITY): Payer: Medicare Other

## 2013-02-17 DIAGNOSIS — D72829 Elevated white blood cell count, unspecified: Secondary | ICD-10-CM

## 2013-02-17 DIAGNOSIS — S0990XA Unspecified injury of head, initial encounter: Secondary | ICD-10-CM

## 2013-02-17 LAB — CBC
HEMATOCRIT: 27.4 % — AB (ref 39.0–52.0)
HEMOGLOBIN: 9.1 g/dL — AB (ref 13.0–17.0)
MCH: 28.2 pg (ref 26.0–34.0)
MCHC: 33.2 g/dL (ref 30.0–36.0)
MCV: 84.8 fL (ref 78.0–100.0)
Platelets: 360 10*3/uL (ref 150–400)
RBC: 3.23 MIL/uL — ABNORMAL LOW (ref 4.22–5.81)
RDW: 14.7 % (ref 11.5–15.5)
WBC: 16.3 10*3/uL — ABNORMAL HIGH (ref 4.0–10.5)

## 2013-02-17 LAB — URINE MICROSCOPIC-ADD ON

## 2013-02-17 LAB — URINALYSIS, ROUTINE W REFLEX MICROSCOPIC
Bilirubin Urine: NEGATIVE
Glucose, UA: NEGATIVE mg/dL
Ketones, ur: NEGATIVE mg/dL
Leukocytes, UA: NEGATIVE
Nitrite: NEGATIVE
PH: 6.5 (ref 5.0–8.0)
Protein, ur: NEGATIVE mg/dL
SPECIFIC GRAVITY, URINE: 1.022 (ref 1.005–1.030)
Urobilinogen, UA: 1 mg/dL (ref 0.0–1.0)

## 2013-02-17 LAB — STREP PNEUMONIAE URINARY ANTIGEN: STREP PNEUMO URINARY ANTIGEN: NEGATIVE

## 2013-02-17 LAB — GLUCOSE, CAPILLARY: Glucose-Capillary: 112 mg/dL — ABNORMAL HIGH (ref 70–99)

## 2013-02-17 MED ORDER — VANCOMYCIN HCL IN DEXTROSE 750-5 MG/150ML-% IV SOLN
750.0000 mg | Freq: Two times a day (BID) | INTRAVENOUS | Status: DC
  Administered 2013-02-17 – 2013-02-18 (×4): 750 mg via INTRAVENOUS
  Filled 2013-02-17 (×7): qty 150

## 2013-02-17 MED ORDER — DEXTROSE 5 % IV SOLN
1.0000 g | Freq: Three times a day (TID) | INTRAVENOUS | Status: DC
  Administered 2013-02-17 – 2013-02-19 (×6): 1 g via INTRAVENOUS
  Filled 2013-02-17 (×8): qty 1

## 2013-02-17 NOTE — Progress Notes (Signed)
ANTIBIOTIC CONSULT NOTE - INITIAL  Pharmacy Consult for Vancomycin and Cefepime Indication: pneumonia (health care associated)  Allergies  Allergen Reactions  . Neomycin-Bacitracin Zn-Polymyx Rash    Patient Measurements:   Height ~ 70 in Weight ~ 61 kg  Vital Signs: Temp: 98.7 F (37.1 C) (01/19 0625) BP: 90/37 mmHg (01/19 0625) Pulse Rate: 99 (01/19 0625) Intake/Output from previous day: 01/18 0701 - 01/19 0700 In: 800 [P.O.:800] Out: 601 [Urine:600; Stool:1] Intake/Output from this shift:    Labs:  Recent Labs  02/15/13 0410 02/16/13 0500 02/17/13 0445  WBC 11.0* 12.5* 16.3*  HGB 8.8* 8.3* 9.1*  PLT 304 321 360  CREATININE 0.96  --   --    The CrCl is unknown because both a height and weight (above a minimum accepted value) are required for this calculation.  CrCl ~ 70 ml/min based on estimated height and weight.    Microbiology: No results found for this or any previous visit (from the past 720 hour(s)).  Medical History: Past Medical History  Diagnosis Date  . Bilateral senile cataracts   . Macular degeneration, bilateral   . COPD (chronic obstructive pulmonary disease)   . Cigarette smoker   . CAD (coronary artery disease)     ?  Marland Kitchen. Peripheral vascular disease   . Alcohol abuse   . Head trauma     closed  . Neuropathy   . Abnormality of gait   . Anxiety   . Shortness of breath     Medications:  Scheduled:  . aspirin EC  325 mg Oral Q breakfast  . folic acid  1 mg Oral Daily  . influenza vac split quadrivalent PF  0.5 mL Intramuscular Tomorrow-1000  . methylPREDNISolone  2 mg Oral Daily  . mometasone-formoterol  2 puff Inhalation BID  . multivitamin with minerals  1 tablet Oral Daily  . pneumococcal 23 valent vaccine  0.5 mL Intramuscular Tomorrow-1000  . sertraline  100 mg Oral Daily  . thiamine  100 mg Oral Daily   Assessment: 73 yo M admitted 1/15 with L hip pain after a fall.  The patient was found to have a hip fracture and had  surgery on 1/16.  Patient is POD#3 and has developed leukocytosis.  CXR concerning for PNA.  Pt started on empicis HCAP coverage with Vancomycin and Cefepime.  Goal of Therapy:  Vancomycin trough level 15-20 mcg/ml Renal dose adjustment of antibiotics  Plan:  Vancomycin 750 mg IV q12h. Cefepime 1gm IV q8h. Follow up culture data, renal function, and clinical progress. Check Vancomycin levels as indicated.  Toys 'R' UsKimberly Minnetta Sandora, Pharm.D., BCPS Clinical Pharmacist Pager [phone] 02/17/2013 10:11 AM

## 2013-02-17 NOTE — Progress Notes (Signed)
Physical Therapy Treatment Patient Details Name: Antonio OsgoodWarren L Marshall MRN: [id number] DOB: 1940/04/13 Today's Date: 02/17/2013 Time: [id number] PT Time Calculation (min): 20 min  PT Assessment / Plan / Recommendation  History of Present Illness Pt is a 73 y/o male admitted s/p left femoral IM nailing after a fall.    PT Comments   Pt making slow progress towards physical therapy goals. Sister present during session and was able to persuade him to participate, however pt mostly resisted therapist during transfers and would not sit up on EOB without max assist to keep trunk elevated. Continues to be unable to use RW at this time due to refusal/inability to hold on to the walker with the RUE at this time. D/C to SNF continues to be appropriate at this time.   Follow Up Recommendations  SNF     Does the patient have the potential to tolerate intense rehabilitation     Barriers to Discharge        Equipment Recommendations  Other (comment) (TBD by next venue of care)    Recommendations for Other Services    Frequency Min 3X/week   Progress towards PT Goals Progress towards PT goals: Progressing toward goals  Plan Current plan remains appropriate    Precautions / Restrictions Precautions Precautions: Fall Restrictions Weight Bearing Restrictions: Yes LLE Weight Bearing: Weight bearing as tolerated   Pertinent Vitals/Pain Unable to rate pain on 0-10 scale, does not appear to be in significant amount of pain throughout session.     Mobility  Bed Mobility Overal bed mobility: Needs Assistance;+2 for physical assistance Bed Mobility: Supine to Sit Supine to sit: Mod assist;+2 for physical assistance General bed mobility comments: VC's for sequencing and technique. Pt did not make corrective changes with cueing, and most assist was for trunk support as pt was coming to full sit on EOB.  Transfers Overall transfer level: Needs assistance Equipment used: None Transfers: Sit to/from  UG[employer] CorporationStand;Stand Pivot Transfers Sit to Stand: Mod assist;+2 physical assistance Stand pivot transfers: Max assist;+2 physical assistance General transfer comment: Pt very agitated while trying to perform SPT. No AD attempted as pt would not hold on to it.  Ambulation/Gait General Gait Details: NT    Exercises General Exercises - Lower Extremity Straight Leg Raises: 5 reps   PT Diagnosis:    PT Problem List:   PT Treatment Interventions:     PT Goals (current goals can now be found in the care plan section) Acute Rehab PT Goals Patient Stated Goal: none stated PT Goal Formulation: With patient Time For Goal Achievement: 03/01/13 Potential to Achieve Goals: Fair  Visit Information  Last PT Received On: 02/17/13 Assistance Needed: +1 History of Present Illness: Pt is a 73 y/o male admitted s/p left femoral IM nailing after a fall.     Subjective Data  Subjective: Pt does not verbalize much throughout session, however became agitated while sitting on EOB. Patient Stated Goal: none stated   Cognition  Cognition Arousal/Alertness: Lethargic Behavior During Therapy: Restless;Agitated;Anxious Overall Cognitive Status: History of cognitive impairments - at baseline    Balance  Balance Overall balance assessment: Needs assistance Sitting-balance support: Feet supported;Bilateral upper extremity supported Sitting balance-Leahy Scale: Poor Sitting balance - Comments: Refused to sit up straight on EOB, required mod-max assist to maintain sitting balance. Would actively push against therapist when trying to assist him.Marland Kitchen.  Postural control: Posterior lean;Right lateral lean  End of Session PT - End of Session Equipment Utilized During Treatment: Gait belt Activity Tolerance:  Patient limited by pain Patient left: in chair;with call bell/phone within reach;with family/visitor present Nurse Communication: Mobility status   GP     Ruthann Cancer 02/17/2013, 4:49 PM  Ruthann Cancer, PT,  DPT [phone]

## 2013-02-17 NOTE — Progress Notes (Signed)
[facility]                                                                              Patient Demographics  Antonio Marshall, is a 73 y.o. male, DOB - 10-16-1940, WUJ:[id number]  Admit date - 02/13/2013   Admitting Physician Henderson Cloud, MD  Outpatient Primary MD for the patient is Michele Mcalpine, MD  LOS - 4   Chief Complaint  Patient presents with  . Hip Injury        Assessment & Plan   Left intertrochanteric hip fracture  -POD 3 from intramedullary Nail femoral left by Dr. Eulah Pont -Ortho following -Continue pain control -Continue PT/OT  Leukocytosis -WBC 16.3, possibly infectious vs acute phase reactant -Will obtain CXR, UA, blood cultures -Patient is afebrile with no complaints  Frequent falls  -Suspect a large component of this is related to his severe cognitive impairment from chronic alcohol abuse.  -TSH 0.676, B12 366, Folate >20, RPR negative -Needs continued PT  Prior history of Alcohol abuse  -According to family, patient has been sober for over one year -Will continue thiamine, multivitamin, and folate.  Acute kidney Injury -Resolved, Cr improving 0.96, likely secondary to dehyration  Anemia, normacytic -H/H 9.1/27.4, baseline Hb 11 -Anemia panel: Iron 13, ferritin 1838 -AST 65, ALT 40, Total bili 0.5, unlikely from liver pathology, will need outpatient follow up -Will continue to monitor and transfuse if Hb <7  COPD -appears compensated -Continue dulera -will order incentive spirometry  History of head trauma and anxiety -Will continue xanax  Code Status: DNR  Family Communication: None at bedside  Disposition Plan: Admitted, will work up leukocytosis, likely discharge 1/20  Time Spent in minutes   20 minutes  Procedures  Left intramedullary femoral nail  Consults   Orthopedic surgery  DVT Prophylaxis  Heparin   Lab Results  Component Value Date   PLT 360 02/17/2013    Medications  Scheduled Meds: .  aspirin EC  325 mg Oral Q breakfast  . folic acid  1 mg Oral Daily  . influenza vac split quadrivalent PF  0.5 mL Intramuscular Tomorrow-1000  . methylPREDNISolone  2 mg Oral Daily  . mometasone-formoterol  2 puff Inhalation BID  . multivitamin with minerals  1 tablet Oral Daily  . pneumococcal 23 valent vaccine  0.5 mL Intramuscular Tomorrow-1000  . sertraline  100 mg Oral Daily  . thiamine  100 mg Oral Daily   Continuous Infusions: . sodium chloride 100 mL/hr at 02/14/13 0500  . lactated ringers    . lactated ringers 50 mL/hr at 02/14/13 1540   PRN Meds:.acetaminophen, acetaminophen, albuterol, ALPRAZolam, fentaNYL, HYDROcodone-acetaminophen, menthol-cetylpyridinium, methocarbamol (ROBAXIN) IV, methocarbamol, metoCLOPramide (REGLAN) injection, metoCLOPramide, morphine injection, ondansetron (ZOFRAN) IV, ondansetron, phenol, senna-docusate  Antibiotics    Anti-infectives   Start     Dose/Rate Route Frequency Ordered Stop   02/14/13 2200  ceFAZolin (ANCEF) IVPB 2 g/50 mL premix     2 g 100 mL/hr over 30 Minutes Intravenous Every 6 hours 02/14/13 1936 02/15/13 0406   02/14/13 0600  ceFAZolin (ANCEF) IVPB 2 g/50 mL premix  Status:  Discontinued     2 g 100 mL/hr over 30 Minutes  Intravenous On call to O.R. 02/14/13 0449 02/14/13 1829        Subjective:   Antonio Marshall seen and examined today.    Appears to be comfortable.  No complaints at this time.  Objective:   Filed Vitals:   02/16/13 1600 02/16/13 2010 02/16/13 2038 02/17/13 0625  BP:  91/47  90/37  Pulse:  71 77 99  Temp:  98.3 F (36.8 C)  98.7 F (37.1 C)  TempSrc:  Oral    Resp: 16 17 16    SpO2: 93% 91% 92% 96%    Wt Readings from Last 3 Encounters:  01/14/13 61.145 kg (134 lb 12.8 oz)  12/13/12 60 kg (132 lb 4.4 oz)  06/25/12 56.065 kg (123 lb 9.6 oz)     Intake/Output Summary (Last 24 hours) at 02/17/13 0935 Last data filed at 02/16/13 2300  Gross per 24 hour  Intake    800 ml  Output    601 ml   Net    199 ml    Exam  General: Well developed, malnourished, NAD, appears stated age  HEENT: NCAT, PERRLA, EOMI, Anicteic Sclera, mucous membranes moist.   Neck: Supple, no JVD, no masses  Cardiovascular: S1 S2 auscultated, no rubs, murmurs or gallops. Regular rate and rhythm.  Respiratory: Scattered rhonchi  Abdomen: Soft, nontender, nondistended, + bowel sounds  Extremities: warm dry without cyanosis clubbing or edema. Bandage noted on left hip  Neuro: AAOx3, cranial nerves grossly intact.   Skin: Without rashes exudates or nodules   Data Review   Micro Results No results found for this or any previous visit (from the past 240 hour(s)).  Radiology Reports Dg Chest 1 View  02/13/2013   CLINICAL DATA:  Pain post trauma  EXAM: CHEST - 1 VIEW  COMPARISON:  January 14, 2013  FINDINGS: Extensive fibrotic change throughout the mid and lower lung zones remains. There is milder scarring in the upper lobes bilaterally. There is no frank edema or consolidation. Heart is upper normal in size with normal pulmonary vascularity. No adenopathy. There is atherosclerotic change in aorta. No pneumothorax. 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 No acute fracture seen.  IMPRESSION: Extensive fibrotic change in mid lower lung zones bilaterally. No frank edema or consolidation.   Electronically Signed   By: Bretta BangWilliam  Woodruff M.D.   On: 02/13/2013 11:02   Dg Hip Complete Left  02/13/2013   CLINICAL DATA:  Pain post trauma  EXAM: LEFT HIP - COMPLETE 2+ VIEW  COMPARISON:  None.  FINDINGS: Frontal pelvis as well as frontal and lateral left hip images were obtained. There is a comminuted intertrochanteric femur fracture on the left with varus angulation at the fracture site. The lesser trochanter is avulsed medially. No other fractures. No dislocation. There is mild symmetric narrowing of both hip joints.  IMPRESSION: Comminuted intertrochanteric  femur fracture on the left with varus angulation at the fracture site.   Electronically Signed   By: Bretta BangWilliam  Woodruff M.D.   On: 02/13/2013 11:01    CBC  Recent Labs Lab 02/13/13 1222 02/14/13 0410 02/15/13 0410 02/16/13 0500 02/17/13 0445  WBC 11.8* 11.7* 11.0* 12.5* 16.3*  HGB 10.1* 9.1* 8.8* 8.3* 9.1*  HCT 29.5* 27.9* 27.6* 25.6* 27.4*  PLT 302 312 304 321 360  MCV 83.6 85.3 88.2 85.6 84.8  MCH 28.6 27.8 28.1 27.8 28.2  MCHC 34.2 32.6 31.9 32.4 33.2  RDW 14.5 14.6 14.9 14.7 14.7  LYMPHSABS  1.4  --   --   --   --   MONOABS 1.1*  --   --   --   --   EOSABS 0.0  --   --   --   --   BASOSABS 0.0  --   --   --   --     Chemistries   Recent Labs Lab 02/13/13 1222 02/14/13 0410 02/15/13 0410 02/16/13 1149  NA 142 144 146  --   K 5.0 4.5 4.8  --   CL 103 107 109  --   CO2 20 20 22   --   GLUCOSE 102* 115* 183*  --   BUN 61* 51* 32*  --   CREATININE 1.45* 1.26 0.96  --   CALCIUM 9.3 8.8 8.6  --   AST  --   --   --  65*  ALT  --   --   --  40  ALKPHOS  --   --   --  43  BILITOT  --   --   --  0.5   ------------------------------------------------------------------------------------------------------------------ CrCl is unknown because both a height and weight (above a minimum accepted value) are required for this calculation. ------------------------------------------------------------------------------------------------------------------ No results found for this basename: HGBA1C,  in the last 72 hours ------------------------------------------------------------------------------------------------------------------ No results found for this basename: CHOL, HDL, LDLCALC, TRIG, CHOLHDL, LDLDIRECT,  in the last 72 hours ------------------------------------------------------------------------------------------------------------------ No results found for this basename: TSH, T4TOTAL, FREET3, T3FREE, THYROIDAB,  in the last 72  hours ------------------------------------------------------------------------------------------------------------------  Recent Labs  02/15/13 0410  VITAMINB12 435  FOLATE >20.0  FERRITIN 1838*  TIBC 124*  IRON 13*  RETICCTPCT 1.2    Coagulation profile  Recent Labs Lab 02/13/13 1222  INR 1.29    No results found for this basename: DDIMER,  in the last 72 hours  Cardiac Enzymes No results found for this basename: CK, CKMB, TROPONINI, MYOGLOBIN,  in the last 168 hours ------------------------------------------------------------------------------------------------------------------ No components found with this basename: POCBNP,     Tandi Hanko D.O. on 02/17/2013 at 9:35 AM  Between 7am to 7pm - Pager - [phone]  After 7pm go to [email or web address] - password [email or web address]  And look for the night coverage person covering for me after hours  [facility] Office  [phone]

## 2013-02-17 NOTE — Care Management Note (Signed)
CARE MANAGEMENT NOTE 02/17/2013  Patient:  Antonio Marshall,[name]   Account Number:  [id number]  Date Initiated:  02/17/2013  Documentation initiated by:  Vance PeperBRADY,Pecola Haxton  Subjective/Objective Assessment:   73 yr old male s/p IM Nailing Of left hip fracture.     Action/Plan:   Pateint is for short term rehab at SNF, will go to [facility]. Social worker is aware.   Anticipated DC Date:  02/18/2013   Anticipated DC Plan:  SKILLED NURSING FACILITY  In-house referral  Clinical Social Worker      DC Planning Services  CM consult      Choice offered to / List presented to:             Status of service:  Completed, signed off Medicare Important Message given?   (If response is "NO", the following Medicare IM given date fields will be blank) Date Medicare IM given:   Date Additional Medicare IM given:    Discharge Disposition:  SKILLED NURSING FACILITY

## 2013-02-17 NOTE — Progress Notes (Signed)
Patient ID: Antonio OsgoodWarren L Kaigler, male   DOB: [birth year], 73 y.o.   MRN: [id number]     Subjective:  Patient reports pain as mild.  Patient appears to be in no distress.  Alert and able to follow commands.  Objective:   VITALS:   Filed Vitals:   02/16/13 1600 02/16/13 2010 02/16/13 2038 02/17/13 0625  BP:  91/47  90/37  Pulse:  71 77 99  Temp:  98.3 F (36.8 C)  98.7 F (37.1 C)  TempSrc:  Oral    Resp: 16 17 16    SpO2: 93% 91% 92% 96%    ABD soft Sensation intact distally Intact pulses distally Dorsiflexion/Plantar flexion intact Incision: dressing C/D/I Able to plantar, dorsiflex left foot and move all toes.  Dressing clean, dry.  No active bleeding.  No signs of infection.   Lab Results  Component Value Date   WBC 16.3* 02/17/2013   HGB 9.1* 02/17/2013   HCT 27.4* 02/17/2013   MCV 84.8 02/17/2013   PLT 360 02/17/2013     Assessment/Plan: 3 Days Post-Op   Principal Problem:   Hip fracture, intertrochanteric Active Problems:   ALCOHOL ABUSE   CIGARETTE SMOKER   NEUROPATHY   COPD   Frequent falls   Fracture of left hip   Anemia   AKI (acute kidney injury)   Advance diet Up with therapy Discharge to SNF Weight bearing as tolerated Dry dressing PRN ABLA stable Discharge per medical service to SNF   Haskel KhanDOUGLAS Kambri Dismore 02/17/2013, 8:24 AM   Margarita Ranaimothy Murphy MD [phone]

## 2013-02-18 ENCOUNTER — Encounter (HOSPITAL_COMMUNITY): Payer: Self-pay | Admitting: Orthopedic Surgery

## 2013-02-18 DIAGNOSIS — J189 Pneumonia, unspecified organism: Secondary | ICD-10-CM

## 2013-02-18 LAB — CBC
HEMATOCRIT: 25.3 % — AB (ref 39.0–52.0)
HEMOGLOBIN: 8.3 g/dL — AB (ref 13.0–17.0)
MCH: 27.9 pg (ref 26.0–34.0)
MCHC: 32.8 g/dL (ref 30.0–36.0)
MCV: 84.9 fL (ref 78.0–100.0)
Platelets: 353 10*3/uL (ref 150–400)
RBC: 2.98 MIL/uL — ABNORMAL LOW (ref 4.22–5.81)
RDW: 14.9 % (ref 11.5–15.5)
WBC: 23.2 10*3/uL — ABNORMAL HIGH (ref 4.0–10.5)

## 2013-02-18 LAB — BASIC METABOLIC PANEL
BUN: 28 mg/dL — AB (ref 6–23)
CO2: 22 mEq/L (ref 19–32)
Calcium: 7.7 mg/dL — ABNORMAL LOW (ref 8.4–10.5)
Chloride: 106 mEq/L (ref 96–112)
Creatinine, Ser: 1.01 mg/dL (ref 0.50–1.35)
GFR calc Af Amer: 83 mL/min — ABNORMAL LOW (ref >90)
GFR, EST NON AFRICAN AMERICAN: 72 mL/min — AB (ref >90)
GLUCOSE: 102 mg/dL — AB (ref 70–99)
Potassium: 4.6 mEq/L (ref 3.7–5.3)
Sodium: 140 mEq/L (ref 137–147)

## 2013-02-18 LAB — LEGIONELLA ANTIGEN, URINE: LEGIONELLA ANTIGEN, URINE: NEGATIVE

## 2013-02-18 NOTE — Progress Notes (Addendum)
[facility]                                                                              Patient Demographics  Antonio Marshall, is a 73 y.o. male, DOB - November 17, 1940, ZOX:[id number]  Admit date - 02/13/2013   Admitting Physician Henderson Cloud, MD  Outpatient Primary MD for the patient is Michele Mcalpine, MD  LOS - 5   Chief Complaint  Patient presents with  . Hip Injury      Interim History: This is a 73 year old male history of alcoholism, sober for over one year that presented with 3 falls and left hip fracture. Patient is postop day 4. He developed leukocytosis, UA is negative, chest x-ray shows pneumonia, which will likely need repeat for resolution. In spite of antibiotics leukocytosis is trending upward. Blood cultures show no growth, C. difficile pending. Patient is from the [facility] and may return when stable.  Assessment & Plan  HCAP -patient continues to have leukocytosis that is trending upward -CXR showed Increased heterogeneous opacities within the bilateral mid and lower lungs are favored to represent acute process such as pneumonia -Continue Vancomycin/Cefepime -UA negative, strep pneumonia urine Ag negative -Will continue to monitor -Patient does not complain of shortness of breath or cough  Left intertrochanteric hip fracture  -POD 4 from intramedullary Nail femoral left by Dr. Eulah Pont -Ortho following -Continue pain control -Continue PT/OT  Leukocytosis -WBC 23.2, possibly infectious vs acute phase reactant -UA negative, blood cultures show no growth -CXR shows possible pneumonia -Will also obtain Cdiff PCR -Continue on IV antibiotics -Patient is afebrile with no complaints  Frequent falls  -Suspect a large component of this is related to his severe cognitive impairment from chronic alcohol abuse.  -TSH 0.676, B12 366, Folate >20, RPR negative -Needs continued PT  Prior history of Alcohol abuse  -According to family, patient has been  sober for over one year -Will continue thiamine, multivitamin, and folate.  Acute kidney Injury -Resolved, Cr improving 0.96, likely secondary to dehyration  Anemia, normacytic -H/H 9.1/27.4, baseline Hb 11 -Anemia panel: Iron 13, ferritin 1838 -AST 65, ALT 40, Total bili 0.5, unlikely from liver pathology, will need outpatient follow up -Will continue to monitor and transfuse if Hb <7  COPD -appears compensated -Continue dulera -will order incentive spirometry  History of head trauma and anxiety -Will continue xanax  Code Status: DNR  Family Communication: None at bedside  Disposition Plan: Admitted, will work up leukocytosis and continue IV antibiotics  Time Spent in minutes   20 minutes  Procedures  Left intramedullary femoral nail  Consults   Orthopedic surgery  DVT Prophylaxis  Heparin   Lab Results  Component Value Date   PLT 353 02/18/2013    Medications  Scheduled Meds: . aspirin EC  325 mg Oral Q breakfast  . ceFEPime (MAXIPIME) IV  1 g Intravenous Q8H  . folic acid  1 mg Oral Daily  . methylPREDNISolone  2 mg Oral Daily  . mometasone-formoterol  2 puff Inhalation BID  . multivitamin with minerals  1 tablet Oral Daily  . pneumococcal 23 valent vaccine  0.5 mL Intramuscular Tomorrow-1000  . sertraline  100 mg Oral  Daily  . thiamine  100 mg Oral Daily  . vancomycin  750 mg Intravenous Q12H   Continuous Infusions: . sodium chloride 75 mL/hr at 02/17/13 1245  . lactated ringers    . lactated ringers 50 mL/hr at 02/14/13 1540   PRN Meds:.acetaminophen, acetaminophen, albuterol, ALPRAZolam, fentaNYL, HYDROcodone-acetaminophen, menthol-cetylpyridinium, methocarbamol (ROBAXIN) IV, methocarbamol, metoCLOPramide (REGLAN) injection, metoCLOPramide, morphine injection, ondansetron (ZOFRAN) IV, ondansetron, phenol, senna-docusate  Antibiotics    Anti-infectives   Start     Dose/Rate Route Frequency Ordered Stop   02/17/13 1200  ceFEPIme (MAXIPIME) 1 g in  dextrose 5 % 50 mL IVPB     1 g 100 mL/hr over 30 Minutes Intravenous Every 8 hours 02/17/13 1014     02/17/13 1100  vancomycin (VANCOCIN) IVPB 750 mg/150 ml premix     750 mg 150 mL/hr over 60 Minutes Intravenous Every 12 hours 02/17/13 1014     02/14/13 2200  ceFAZolin (ANCEF) IVPB 2 g/50 mL premix     2 g 100 mL/hr over 30 Minutes Intravenous Every 6 hours 02/14/13 1936 02/15/13 0406   02/14/13 0600  ceFAZolin (ANCEF) IVPB 2 g/50 mL premix  Status:  Discontinued     2 g 100 mL/hr over 30 Minutes Intravenous On call to O.R. 02/14/13 0449 02/14/13 1829        Subjective:   Antonio Marshall seen and examined today.    Appears to be comfortable.  No complaints at this time, however, states he is sleepy.  Objective:   Filed Vitals:   02/17/13 0625 02/17/13 1300 02/17/13 2150 02/18/13 0620  BP: 90/37 100/52 108/43 106/45  Pulse: 99 78 75 72  Temp: 98.7 F (37.1 C) 99.1 F (37.3 C) 98.6 F (37 C) 98.6 F (37 C)  TempSrc:      Resp:  17 18 18   SpO2: 96% 98% 98% 99%    Wt Readings from Last 3 Encounters:  01/14/13 61.145 kg (134 lb 12.8 oz)  12/13/12 60 kg (132 lb 4.4 oz)  06/25/12 56.065 kg (123 lb 9.6 oz)     Intake/Output Summary (Last 24 hours) at 02/18/13 0730 Last data filed at 02/18/13 0100  Gross per 24 hour  Intake    510 ml  Output    650 ml  Net   -140 ml    Exam  General: Well developed, malnourished, NAD, appears stated age  HEENT: NCAT, PERRLA, EOMI, Anicteic Sclera, mucous membranes moist.   Neck: Supple, no JVD, no masses  Cardiovascular: S1 S2 auscultated, no rubs, murmurs or gallops. Regular rate and rhythm.  Respiratory: Scattered rhonchi  Abdomen: Soft, nontender, nondistended, + bowel sounds  Extremities: warm dry without cyanosis clubbing or edema. Bandage noted on left hip  Neuro: AAOx3, cranial nerves grossly intact.   Skin: Without rashes exudates or nodules   Data Review   Micro Results No results found for this or any  previous visit (from the past 240 hour(s)).  Radiology Reports Dg Chest 1 View  02/13/2013   CLINICAL DATA:  Pain post trauma  EXAM: CHEST - 1 VIEW  COMPARISON:  January 14, 2013  FINDINGS: Extensive fibrotic change throughout the mid and lower lung zones remains. There is milder scarring in the upper lobes bilaterally. There is no frank edema or consolidation. Heart is upper normal in size with normal pulmonary vascularity. No adenopathy. There is atherosclerotic change in aorta. No pneumothorax. 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 No acute  fracture seen.  IMPRESSION: Extensive fibrotic change in mid lower lung zones bilaterally. No frank edema or consolidation.   Electronically Signed   By: Bretta Bang M.D.   On: 02/13/2013 11:02   Dg Hip Complete Left  02/13/2013   CLINICAL DATA:  Pain post trauma  EXAM: LEFT HIP - COMPLETE 2+ VIEW  COMPARISON:  None.  FINDINGS: Frontal pelvis as well as frontal and lateral left hip images were obtained. There is a comminuted intertrochanteric femur fracture on the left with varus angulation at the fracture site. The lesser trochanter is avulsed medially. No other fractures. No dislocation. There is mild symmetric narrowing of both hip joints.  IMPRESSION: Comminuted intertrochanteric femur fracture on the left with varus angulation at the fracture site.   Electronically Signed   By: Bretta Bang M.D.   On: 02/13/2013 11:01    CBC  Recent Labs Lab 02/13/13 1222 02/14/13 0410 02/15/13 0410 02/16/13 0500 02/17/13 0445 02/18/13 0517  WBC 11.8* 11.7* 11.0* 12.5* 16.3* 23.2*  HGB 10.1* 9.1* 8.8* 8.3* 9.1* 8.3*  HCT 29.5* 27.9* 27.6* 25.6* 27.4* 25.3*  PLT 302 312 304 321 360 353  MCV 83.6 85.3 88.2 85.6 84.8 84.9  MCH 28.6 27.8 28.1 27.8 28.2 27.9  MCHC 34.2 32.6 31.9 32.4 33.2 32.8  RDW 14.5 14.6 14.9 14.7 14.7 14.9  LYMPHSABS 1.4  --   --   --   --   --   MONOABS 1.1*  --   --   --    --   --   EOSABS 0.0  --   --   --   --   --   BASOSABS 0.0  --   --   --   --   --     Chemistries   Recent Labs Lab 02/13/13 1222 02/14/13 0410 02/15/13 0410 02/16/13 1149 02/18/13 0517  NA 142 144 146  --  140  K 5.0 4.5 4.8  --  4.6  CL 103 107 109  --  106  CO2 20 20 22   --  22  GLUCOSE 102* 115* 183*  --  102*  BUN 61* 51* 32*  --  28*  CREATININE 1.45* 1.26 0.96  --  1.01  CALCIUM 9.3 8.8 8.6  --  7.7*  AST  --   --   --  65*  --   ALT  --   --   --  40  --   ALKPHOS  --   --   --  43  --   BILITOT  --   --   --  0.5  --    ------------------------------------------------------------------------------------------------------------------ CrCl is unknown because both a height and weight (above a minimum accepted value) are required for this calculation. ------------------------------------------------------------------------------------------------------------------ No results found for this basename: HGBA1C,  in the last 72 hours ------------------------------------------------------------------------------------------------------------------ No results found for this basename: CHOL, HDL, LDLCALC, TRIG, CHOLHDL, LDLDIRECT,  in the last 72 hours ------------------------------------------------------------------------------------------------------------------ No results found for this basename: TSH, T4TOTAL, FREET3, T3FREE, THYROIDAB,  in the last 72 hours ------------------------------------------------------------------------------------------------------------------ No results found for this basename: VITAMINB12, FOLATE, FERRITIN, TIBC, IRON, RETICCTPCT,  in the last 72 hours  Coagulation profile  Recent Labs Lab 02/13/13 1222  INR 1.29    No results found for this basename: DDIMER,  in the last 72 hours  Cardiac Enzymes No results found for this basename: CK, CKMB, TROPONINI, MYOGLOBIN,  in the last 168  hours ------------------------------------------------------------------------------------------------------------------ No components found with this  basename: POCBNPCatha Gosselin, Carrington Olazabal D.O. on 02/18/2013 at 7:30 AM  Between 7am to 7pm - Pager - [phone]  After 7pm go to [email or web address] - password [email or web address]  And look for the night coverage person covering for me after hours  [facility] Office  [phone]

## 2013-02-18 NOTE — Progress Notes (Signed)
I participated in the care of this patient and agree with the above history, physical and evaluation. I performed a review of the history and a physical exam as detailed   Vassie Kugel Daniel Nimesh Riolo MD  

## 2013-02-18 NOTE — Progress Notes (Signed)
Patient ID: Antonio Marshall L Bellotti, male   DOB: 02-09-1940, 73 y.o.   MRN: [id number]     Subjective:  Patient reports pain as mild.  Patient alert and follows commands.  No acute distress.  Objective:   VITALS:   Filed Vitals:   02/17/13 0625 02/17/13 1300 02/17/13 2150 02/18/13 0620  BP: 90/37 100/52 108/43 106/45  Pulse: 99 78 75 72  Temp: 98.7 F (37.1 C) 99.1 F (37.3 C) 98.6 F (37 C) 98.6 F (37 C)  TempSrc:      Resp:  17 18 18   SpO2: 96% 98% 98% 99%    ABD soft Sensation intact distally Dorsiflexion/Plantar flexion intact Incision: dressing C/D/I and no drainage   Lab Results  Component Value Date   WBC 23.2* 02/18/2013   HGB 8.3* 02/18/2013   HCT 25.3* 02/18/2013   MCV 84.9 02/18/2013   PLT 353 02/18/2013     Assessment/Plan: 4 Days Post-Op   Principal Problem:   Hip fracture, intertrochanteric Active Problems:   ALCOHOL ABUSE   CIGARETTE SMOKER   NEUROPATHY   COPD   Frequent falls   Fracture of left hip   Anemia   AKI (acute kidney injury)   Advance diet Up with therapy Continue plan per medicine WBAT Dry dressing PRN Follow up in two weeks with Antonio Marshall   Antonio Marshall 02/18/2013, 7:17 AM   Antonio Ranaimothy Murphy MD [phone]

## 2013-02-18 NOTE — Progress Notes (Signed)
I participated in the care of this patient and agree with the above history, physical and evaluation. I performed a review of the history and a physical exam as detailed   Duel Conrad Daniel Rakan Soffer MD  

## 2013-02-19 LAB — CBC
HCT: 26.1 % — ABNORMAL LOW (ref 39.0–52.0)
Hemoglobin: 8.4 g/dL — ABNORMAL LOW (ref 13.0–17.0)
MCH: 27.6 pg (ref 26.0–34.0)
MCHC: 32.2 g/dL (ref 30.0–36.0)
MCV: 85.9 fL (ref 78.0–100.0)
Platelets: 375 10*3/uL (ref 150–400)
RBC: 3.04 MIL/uL — ABNORMAL LOW (ref 4.22–5.81)
RDW: 14.9 % (ref 11.5–15.5)
WBC: 17.5 10*3/uL — ABNORMAL HIGH (ref 4.0–10.5)

## 2013-02-19 LAB — CLOSTRIDIUM DIFFICILE BY PCR: Toxigenic C. Difficile by PCR: POSITIVE — AB

## 2013-02-19 MED ORDER — VANCOMYCIN 50 MG/ML ORAL SOLUTION
125.0000 mg | Freq: Four times a day (QID) | ORAL | Status: DC
  Administered 2013-02-19 – 2013-02-20 (×5): 125 mg via ORAL
  Filled 2013-02-19 (×7): qty 2.5

## 2013-02-19 MED ORDER — ALBUTEROL SULFATE HFA 108 (90 BASE) MCG/ACT IN AERS: 1.0000 | INHALATION_SPRAY | Freq: Four times a day (QID) | RESPIRATORY_TRACT | Status: DC | PRN

## 2013-02-19 MED ORDER — IPRATROPIUM-ALBUTEROL 0.5-2.5 (3) MG/3ML IN SOLN
3.0000 mL | Freq: Four times a day (QID) | RESPIRATORY_TRACT | Status: DC
  Administered 2013-02-19 – 2013-02-20 (×4): 3 mL via RESPIRATORY_TRACT
  Filled 2013-02-19 (×4): qty 3

## 2013-02-19 MED ORDER — METHYLPREDNISOLONE 2 MG PO TABS
1.0000 mg | ORAL_TABLET | Freq: Every day | ORAL | Status: DC
  Filled 2013-02-19: qty 0.5

## 2013-02-19 MED ORDER — LEVOFLOXACIN 750 MG PO TABS
750.0000 mg | ORAL_TABLET | Freq: Every day | ORAL | Status: DC
Start: 2013-02-19 — End: 2013-02-19
  Filled 2013-02-19: qty 1

## 2013-02-19 NOTE — Progress Notes (Signed)
[facility]                                                                              Patient Demographics  Antonio Marshall, is a 73 y.o. male, DOB - 15-Feb-1940, OZH:[id number]  Admit date - 02/13/2013   Admitting Physician Henderson Cloud, MD  Outpatient Primary MD for the patient is Michele Mcalpine, MD  LOS - 6   Chief Complaint  Patient presents with  . Hip Injury        Assessment & Plan   Left intertrochanteric hip fracture  -s/p intramedullary Nail femoral left by Dr. Eulah Pont 1.16.15 -Ortho following -WBAT L Leg -Continue pain control-d/c'd robaxin 1/21, continue HC 5-325 1-2 tabs q6 prn pain -Continue PT/OT  CDIFF + -WBC 17.5, has sputum -CXR 1/19 suggests PNA, DDx atelcatasis -added IS/Flutter valve, PRN q 6 nebs given prior smoking h/o -OOB with meals -Patient is afebrile, Unlikely has both 1/19 Pneumonia and C.diff -Rx Vancomycin 14 days, stop date 03/05/13  Frequent falls  -Suspect a large component of this is related to his severe cognitive impairment from chronic alcohol abuse.  -TSH 0.676, B12 366, Folate >20, RPR negative -Needs continued PT  Prior history of Alcohol abuse  -According to family, patient has been sober for over one year -Will continue thiamine, multivitamin, and folate.  Acute kidney Injury -Resolved, Cr improvinglikely secondary to vulome depletion  Anemia, normacytic -H/H 9.1/27.4, baseline Hb 11 -Anemia panel: Iron 13, ferritin 1838 -AST 65, ALT 40, Total bili 0.5, unlikely from liver pathology, will need outpatient follow up -Will continue to monitor and transfuse if Hb <7  COPD -appears compensated -Continue dulera -will order incentive spirometry  History of head trauma and anxiety -Will continue xanax  Code Status: DNR  Family Communication: Discussed c daughter Disposition Plan: ? discharge 1/22  Time Spent in minutes   20 minutes  Procedures  Left intramedullary femoral nail 1/16  Consults    Orthopedic surgery  DVT Prophylaxis  Heparin   Lab Results  Component Value Date   PLT 375 02/19/2013    Medications  Scheduled Meds: . aspirin EC  325 mg Oral Q breakfast  . folic acid  1 mg Oral Daily  . methylPREDNISolone  2 mg Oral Daily  . mometasone-formoterol  2 puff Inhalation BID  . multivitamin with minerals  1 tablet Oral Daily  . pneumococcal 23 valent vaccine  0.5 mL Intramuscular Tomorrow-1000  . sertraline  100 mg Oral Daily  . thiamine  100 mg Oral Daily   Continuous Infusions: . sodium chloride 75 mL/hr at 02/17/13 1245   PRN Meds:.acetaminophen, acetaminophen, albuterol, ALPRAZolam, HYDROcodone-acetaminophen, menthol-cetylpyridinium, methocarbamol (ROBAXIN) IV, methocarbamol, metoCLOPramide (REGLAN) injection, metoCLOPramide, morphine injection, ondansetron (ZOFRAN) IV, ondansetron, phenol, senna-docusate  Antibiotics    Anti-infectives   Start     Dose/Rate Route Frequency Ordered Stop   02/17/13 1200  ceFEPIme (MAXIPIME) 1 g in dextrose 5 % 50 mL IVPB  Status:  Discontinued     1 g 100 mL/hr over 30 Minutes Intravenous Every 8 hours 02/17/13 1014 02/19/13 0830   02/17/13 1100  vancomycin (VANCOCIN) IVPB 750 mg/150 ml premix  Status:  Discontinued  750 mg 150 mL/hr over 60 Minutes Intravenous Every 12 hours 02/17/13 1014 02/19/13 0830   02/14/13 2200  ceFAZolin (ANCEF) IVPB 2 g/50 mL premix     2 g 100 mL/hr over 30 Minutes Intravenous Every 6 hours 02/14/13 1936 02/15/13 0406   02/14/13 0600  ceFAZolin (ANCEF) IVPB 2 g/50 mL premix  Status:  Discontinued     2 g 100 mL/hr over 30 Minutes Intravenous On call to O.R. 02/14/13 0449 02/14/13 1829        Subjective:   Antonio Marshall seen and examined today.    Appears to be comfortable.  No complaints at this time.  Objective:   Filed Vitals:   02/18/13 2011 02/18/13 2211 02/19/13 0454 02/19/13 0833  BP: 108/43  97/46   Pulse: 70  69   Temp: 98.7 F (37.1 C)  97.8 F (36.6 C)   TempSrc:  Oral  Oral   Resp: 18  18   SpO2: 92% 93% 96% 92%    Wt Readings from Last 3 Encounters:  01/14/13 61.145 kg (134 lb 12.8 oz)  12/13/12 60 kg (132 lb 4.4 oz)  06/25/12 56.065 kg (123 lb 9.6 oz)     Intake/Output Summary (Last 24 hours) at 02/19/13 1155 Last data filed at 02/19/13 1610  Gross per 24 hour  Intake   1220 ml  Output      0 ml  Net   1220 ml    Exam  General: Well developed, malnourished, NAD, appears stated age  HEENT: NCAT, PERRLA, EOMI, Anicteic Sclera, mucous membranes moist.   Neck: Supple, no JVD, no masses  Cardiovascular: S1 S2 auscultated, no rubs, murmurs or gallops. Regular rate and rhythm.  Respiratory: Scattered rhonchi  Abdomen: Soft, nontender, nondistended, + bowel sounds  Extremities: warm dry without cyanosis clubbing or edema. Bandage noted on left hip  Neuro: AAOx3, cranial nerves grossly intact.   Skin: Without rashes exudates or nodules   Data Review   Micro Results Recent Results (from the past 240 hour(s))  CULTURE, BLOOD (ROUTINE X 2)     Status: None   Collection Time    02/17/13  8:21 AM      Result Value Range Status   Specimen Description BLOOD RIGHT HAND   Final   Special Requests BOTTLES DRAWN AEROBIC ONLY 2CC   Final   Culture  Setup Time     Final   Value: 02/17/2013 12:49     Performed at [facility]   Culture     Final   Value:        BLOOD CULTURE RECEIVED NO GROWTH TO DATE CULTURE WILL BE HELD FOR 5 DAYS BEFORE ISSUING A FINAL NEGATIVE REPORT     Performed at [facility]   Report Status PENDING   Incomplete  CULTURE, BLOOD (ROUTINE X 2)     Status: None   Collection Time    02/17/13 12:00 PM      Result Value Range Status   Specimen Description BLOOD RIGHT HAND   Final   Special Requests BOTTLES DRAWN AEROBIC ONLY 5CC   Final   Culture  Setup Time     Final   Value: 02/17/2013 16:07     Performed at [facility]   Culture     Final   Value:        BLOOD CULTURE RECEIVED NO  GROWTH TO DATE CULTURE WILL BE HELD FOR 5 DAYS BEFORE ISSUING A FINAL NEGATIVE REPORT  [facility]   Report Status PENDING   Incomplete    Radiology Reports Dg Chest 1 View  02/13/2013   CLINICAL DATA:  Pain post trauma  EXAM: CHEST - 1 VIEW  COMPARISON:  January 14, 2013  FINDINGS: Extensive fibrotic change throughout the mid and lower lung zones remains. There is milder scarring in the upper lobes bilaterally. There is no frank edema or consolidation. Heart is upper normal in size with normal pulmonary vascularity. No adenopathy. There is atherosclerotic change in aorta. No pneumothorax. 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 No acute fracture seen.  IMPRESSION: Extensive fibrotic change in mid lower lung zones bilaterally. No frank edema or consolidation.   Electronically Signed   By: Bretta BangWilliam  Woodruff M.D.   On: 02/13/2013 11:02   Dg Hip Complete Left  02/13/2013   CLINICAL DATA:  Pain post trauma  EXAM: LEFT HIP - COMPLETE 2+ VIEW  COMPARISON:  None.  FINDINGS: Frontal pelvis as well as frontal and lateral left hip images were obtained. There is a comminuted intertrochanteric femur fracture on the left with varus angulation at the fracture site. The lesser trochanter is avulsed medially. No other fractures. No dislocation. There is mild symmetric narrowing of both hip joints.  IMPRESSION: Comminuted intertrochanteric femur fracture on the left with varus angulation at the fracture site.   Electronically Signed   By: Bretta BangWilliam  Woodruff M.D.   On: 02/13/2013 11:01    CBC  Recent Labs Lab 02/13/13 1222  02/15/13 0410 02/16/13 0500 02/17/13 0445 02/18/13 0517 02/19/13 0520  WBC 11.8*  < > 11.0* 12.5* 16.3* 23.2* 17.5*  HGB 10.1*  < > 8.8* 8.3* 9.1* 8.3* 8.4*  HCT 29.5*  < > 27.6* 25.6* 27.4* 25.3* 26.1*  PLT 302  < > 304 321 360 353 375  MCV 83.6  < > 88.2 85.6 84.8 84.9 85.9  MCH 28.6  < > 28.1 27.8 28.2  27.9 27.6  MCHC 34.2  < > 31.9 32.4 33.2 32.8 32.2  RDW 14.5  < > 14.9 14.7 14.7 14.9 14.9  LYMPHSABS 1.4  --   --   --   --   --   --   MONOABS 1.1*  --   --   --   --   --   --   EOSABS 0.0  --   --   --   --   --   --   BASOSABS 0.0  --   --   --   --   --   --   < > = values in this interval not displayed.  Chemistries   Recent Labs Lab 02/13/13 1222 02/14/13 0410 02/15/13 0410 02/16/13 1149 02/18/13 0517  NA 142 144 146  --  140  K 5.0 4.5 4.8  --  4.6  CL 103 107 109  --  106  CO2 20 20 22   --  22  GLUCOSE 102* 115* 183*  --  102*  BUN 61* 51* 32*  --  28*  CREATININE 1.45* 1.26 0.96  --  1.01  CALCIUM 9.3 8.8 8.6  --  7.7*  AST  --   --   --  65*  --   ALT  --   --   --  40  --   ALKPHOS  --   --   --  43  --   BILITOT  --   --   --  0.5  --    ------------------------------------------------------------------------------------------------------------------ CrCl is unknown because both a height and weight (above a minimum accepted value) are required for this calculation. ------------------------------------------------------------------------------------------------------------------ No results found for this basename: HGBA1C,  in the last 72 hours ------------------------------------------------------------------------------------------------------------------ No results found for this basename: CHOL, HDL, LDLCALC, TRIG, CHOLHDL, LDLDIRECT,  in the last 72 hours ------------------------------------------------------------------------------------------------------------------ No results found for this basename: TSH, T4TOTAL, FREET3, T3FREE, THYROIDAB,  in the last 72 hours ------------------------------------------------------------------------------------------------------------------ No results found for this basename: VITAMINB12, FOLATE, FERRITIN, TIBC, IRON, RETICCTPCT,  in the last 72 hours  Coagulation profile  Recent Labs Lab 02/13/13 1222  INR 1.29     No results found for this basename: DDIMER,  in the last 72 hours  Cardiac Enzymes No results found for this basename: CK, CKMB, TROPONINI, MYOGLOBIN,  in the last 168 hours ------------------------------------------------------------------------------------------------------------------ No components found with this basename: POCBNP,     Lonnette Shrode, JAI-GURMUKH D.O. on 02/19/2013 at 11:55 AM  Between 7am to 7pm - Pager - [phone]  After 7pm go to [email or web address] - password [email or web address]  And look for the night coverage person covering for me after hours  [facility] Office  [phone]

## 2013-02-19 NOTE — Progress Notes (Signed)
ANTIBIOTIC CONSULT NOTE - INITIAL  Pharmacy Consult for Levaquin Indication: pneumonia   Allergies  Allergen Reactions  . Neomycin-Bacitracin Zn-Polymyx Rash    Patient Measurements:   Height ~ 70 in Weight ~ 61 kg  Vital Signs: Temp: 97.8 F (36.6 C) (01/21 0454) Temp src: Oral (01/21 0454) BP: 97/46 mmHg (01/21 0454) Pulse Rate: 69 (01/21 0454) Intake/Output from previous day: 01/20 0701 - 01/21 0700 In: 1220 [P.O.:570; IV Piggyback:650] Out: 100 [Urine:100] Intake/Output from this shift: Total I/O In: 240 [P.O.:240] Out: -   Labs:  Recent Labs  02/17/13 0445 02/18/13 0517 02/19/13 0520  WBC 16.3* 23.2* 17.5*  HGB 9.1* 8.3* 8.4*  PLT 360 353 375  CREATININE  --  1.01  --    The CrCl is unknown because both a height and weight (above a minimum accepted value) are required for this calculation.  CrCl ~ 65 ml/min based on estimated height and weight.    Microbiology: Recent Results (from the past 720 hour(s))  CULTURE, BLOOD (ROUTINE X 2)     Status: None   Collection Time    02/17/13  8:21 AM      Result Value Range Status   Specimen Description BLOOD RIGHT HAND   Final   Special Requests BOTTLES DRAWN AEROBIC ONLY 2CC   Final   Culture  Setup Time     Final   Value: 02/17/2013 12:49     Performed at [facility]   Culture     Final   Value:        BLOOD CULTURE RECEIVED NO GROWTH TO DATE CULTURE WILL BE HELD FOR 5 DAYS BEFORE ISSUING A FINAL NEGATIVE REPORT     Performed at [facility]   Report Status PENDING   Incomplete  CULTURE, BLOOD (ROUTINE X 2)     Status: None   Collection Time    02/17/13 12:00 PM      Result Value Range Status   Specimen Description BLOOD RIGHT HAND   Final   Special Requests BOTTLES DRAWN AEROBIC ONLY 5CC   Final   Culture  Setup Time     Final   Value: 02/17/2013 16:07     Performed at [facility]   Culture     Final   Value:        BLOOD CULTURE RECEIVED NO GROWTH TO DATE CULTURE WILL BE  HELD FOR 5 DAYS BEFORE ISSUING A FINAL NEGATIVE REPORT     Performed at [facility]   Report Status PENDING   Incomplete  CLOSTRIDIUM DIFFICILE BY PCR     Status: Abnormal   Collection Time    02/19/13 10:28 AM      Result Value Range Status   C difficile by pcr POSITIVE (*) NEGATIVE Final   Comment: CRITICAL RESULT CALLED TO, READ BACK BY AND VERIFIED WITH:     C. [facility] RN 12:10 02/19/13 ([email or web address])    Medical History: Past Medical History  Diagnosis Date  . Bilateral senile cataracts   . Macular degeneration, bilateral   . COPD (chronic obstructive pulmonary disease)   . Cigarette smoker   . CAD (coronary artery disease)     ?  Marland Kitchen Peripheral vascular disease   . Alcohol abuse   . Head trauma     closed  . Neuropathy   . Abnormality of gait   . Anxiety   . Shortness of breath     Medications:  Scheduled:  . aspirin EC  325 mg Oral Q breakfast  . folic acid  1 mg Oral Daily  . methylPREDNISolone  2 mg Oral Daily  . mometasone-formoterol  2 puff Inhalation BID  . multivitamin with minerals  1 tablet Oral Daily  . pneumococcal 23 valent vaccine  0.5 mL Intramuscular Tomorrow-1000  . sertraline  100 mg Oral Daily  . thiamine  100 mg Oral Daily   Assessment: 73 yo M admitted 1/15 with L hip pain after a fall.  The patient was found to have a hip fracture and had surgery on 1/16.  Patient developed leukocytosis on POD#3 and was started on Vancomycin and Cefepime.  Today is day 3 of antibiotics and plan to transition to oral Levaquin.  Goal of Therapy:  Renal dose adjustment of antibiotics  Plan:  Levaquin 750 mg PO daily. Anticipate renal function will remain stable with CrCl >50 and further dose adjustments not needed.  Rx will sign off.  Toys 'R' UsKimberly Dudley Cooley, Pharm.D., BCPS Clinical Pharmacist Pager [phone] 02/19/2013 12:08 PM

## 2013-02-19 NOTE — Progress Notes (Signed)
Sample collected for C. Diff, results returned that patient is positive for C. Diff.  Dr. Mahala MenghiniSamtani aware.

## 2013-02-19 NOTE — Progress Notes (Signed)
Physical Therapy Treatment Patient Details Name: Antonio Marshall MRN: [id number] DOB: 01-23-1941 Today's Date: 02/19/2013 Time: [id number] PT Time Calculation (min): 23 min  PT Assessment / Plan / Recommendation  History of Present Illness Pt is a 73 y/o male admitted s/p left femoral IM nailing after a fall.    PT Comments   Pt making slow progress with mobility.  Upon arrival pt sitting in recliner but sliding out of chair with chair alarm going off.  Pt cont's to require +2 assist for transfers & has yet been able to attempt ambulation.  Required total assist for pericare & gown change once supine in bed.     Follow Up Recommendations  SNF     Does the patient have the potential to tolerate intense rehabilitation     Barriers to Discharge        Equipment Recommendations       Recommendations for Other Services    Frequency Min 3X/week   Progress towards PT Goals    Plan Current plan remains appropriate    Precautions / Restrictions Precautions Precautions: Fall Restrictions LLE Weight Bearing: Weight bearing as tolerated       Mobility  Bed Mobility Overal bed mobility: Needs Assistance Bed Mobility: Sit to Supine;Rolling Rolling: Mod assist Sit to supine: Mod assist;+2 for physical assistance General bed mobility comments: (A) to lift LE's back into bed & +2 assist to scoot to [facility] with use of draw pad.   Transfers Overall transfer level: Needs assistance Equipment used: None Transfers: Sit to/from [employer] CorporationStand;Stand Pivot Transfers Sit to Stand: +2 physical assistance;Mod assist Stand pivot transfers: +2 physical assistance;Mod assist General transfer comment: cues for technique.  Did not use RW due to pt unable to use LUE sufficiently.  (A) to achieve standing, balance, rotation of hips, & controlled descent.       PT Goals (current goals can now be found in the care plan section) Acute Rehab PT Goals PT Goal Formulation: With patient Time For Goal Achievement:  03/01/13 Potential to Achieve Goals: Fair  Visit Information  Last PT Received On: 02/19/13 Assistance Needed: +2 History of Present Illness: Pt is a 73 y/o male admitted s/p left femoral IM nailing after a fall.     Subjective Data      Cognition  Cognition Arousal/Alertness: Awake/alert Overall Cognitive Status: History of cognitive impairments - at baseline    Balance     End of Session PT - End of Session Activity Tolerance: Patient tolerated treatment well Patient left: in bed;with call bell/phone within reach;with bed alarm set Nurse Communication: Mobility status   GP     Lara MulchCooper, Lanyia Jewel Lynn 02/19/2013, 2:57 PM   Verdell FaceKelly Shanitra Phillippi, PTA [phone] 02/19/2013

## 2013-02-20 LAB — CBC WITH DIFFERENTIAL/PLATELET
BASOS ABS: 0 10*3/uL (ref 0.0–0.1)
Basophils Relative: 0 % (ref 0–1)
Eosinophils Absolute: 0.2 10*3/uL (ref 0.0–0.7)
Eosinophils Relative: 1 % (ref 0–5)
HCT: 24.8 % — ABNORMAL LOW (ref 39.0–52.0)
Hemoglobin: 8 g/dL — ABNORMAL LOW (ref 13.0–17.0)
LYMPHS PCT: 8 % — AB (ref 12–46)
Lymphs Abs: 1.2 10*3/uL (ref 0.7–4.0)
MCH: 27.4 pg (ref 26.0–34.0)
MCHC: 32.3 g/dL (ref 30.0–36.0)
MCV: 84.9 fL (ref 78.0–100.0)
Monocytes Absolute: 0.8 10*3/uL (ref 0.1–1.0)
Monocytes Relative: 5 % (ref 3–12)
NEUTROS PCT: 85 % — AB (ref 43–77)
Neutro Abs: 12.2 10*3/uL — ABNORMAL HIGH (ref 1.7–7.7)
PLATELETS: 371 10*3/uL (ref 150–400)
RBC: 2.92 MIL/uL — ABNORMAL LOW (ref 4.22–5.81)
RDW: 14.6 % (ref 11.5–15.5)
WBC: 14.3 10*3/uL — AB (ref 4.0–10.5)

## 2013-02-20 MED ORDER — ASPIRIN EC 81 MG PO TBEC: 81.0000 mg | DELAYED_RELEASE_TABLET | Freq: Every day | ORAL | Status: DC

## 2013-02-20 MED ORDER — VANCOMYCIN 50 MG/ML ORAL SOLUTION: 125.0000 mg | Freq: Four times a day (QID) | ORAL | Status: AC

## 2013-02-20 MED ORDER — ASPIRIN 325 MG PO TBEC: 325.0000 mg | DELAYED_RELEASE_TABLET | Freq: Every day | ORAL | Status: AC

## 2013-02-20 MED ORDER — IPRATROPIUM-ALBUTEROL 0.5-2.5 (3) MG/3ML IN SOLN: 3.0000 mL | RESPIRATORY_TRACT | Status: AC | PRN

## 2013-02-20 MED ORDER — METHYLPREDNISOLONE 2 MG PO TABS
2.0000 mg | ORAL_TABLET | Freq: Once | ORAL | Status: AC
  Administered 2013-02-20: 2 mg via ORAL
  Filled 2013-02-20: qty 1

## 2013-02-20 MED ORDER — METHYLPREDNISOLONE 2 MG PO TABS: 1.0000 mg | ORAL_TABLET | Freq: Every day | ORAL | Status: AC

## 2013-02-20 MED ORDER — HYDROCODONE-ACETAMINOPHEN 5-325 MG PO TABS: 2.0000 | ORAL_TABLET | ORAL | Status: AC | PRN

## 2013-02-20 MED ORDER — ALPRAZOLAM 0.25 MG PO TABS: 0.2500 mg | ORAL_TABLET | Freq: Two times a day (BID) | ORAL | Status: AC | PRN

## 2013-02-20 MED ORDER — METHYLPREDNISOLONE 2 MG PO TABS: 1.0000 mg | ORAL_TABLET | Freq: Every day | ORAL | Status: DC

## 2013-02-20 NOTE — Discharge Summary (Signed)
Physician Discharge Summary  Antonio Marshall ZOX:[id number] DOB: July 12, 1940 DOA: 02/13/2013  PCP: Michele McalpineNADEL,SCOTT M, MD  Admit date: 02/13/2013 Discharge date: 02/20/2013  Time spent: 45 miinutes  Recommendations for Outpatient Follow-up:  1. Need to continue by mouth vancomycin solution until 03/05/13 2. Patient is weight bearing as tolerated on left lower extremity and should be followed by Dr. Margarita Ranaimothy Murphy in about 2 weeks at his office--DVT prophylaxis his aspirin 325 mg for 4-6 weeks subsequent to surgery which was on 1/16 3. Patient will need a CBC plus basic metabolic panel plus INR in about a week 4. Consider workup for  Korsakoff syndrome given chronic daily alcohol use for the majority of his life 5. Considered slow taper as an outpatient of Xanax/benzodiazepine 6. For some reason is on chronic Medrol.  This could possibly be secondary to COPD. I have initiated a slow taper which should be discontinued on 02/25/13 7. Patient will at least 1 L of oxygen at rest 2 L with activity-this can be determined as an outpatient   Discharge Diagnoses:  Principal Problem:   Hip fracture, intertrochanteric Active Problems:   ALCOHOL ABUSE   CIGARETTE SMOKER   NEUROPATHY   COPD   Frequent falls   Fracture of left hip   Anemia   AKI (acute kidney injury)   Discharge Condition: Stable  Diet recommendation: Regular  There were no vitals filed for this visit.  History of present illness:  This is a 73 year old male history of alcoholism, sober for over one year that presented with 3 falls and left hip fracture. Patient admitted 1/16 ,  had IM nailing that day  He developed leukocytosis, UA is negative, chest x-ray shows pneumonia, which will likely need repeat for resolution.  In spite of antibiotics leukocytosis is trending upward.  Blood cultures show no growth, C. difficile came back positive    Hospital Course:  Left intertrochanteric hip fracture  -s/p intramedullary Nail femoral  left by Dr. Eulah PontMurphy 1.16.15  -Ortho following  -WBAT L Leg  -Continue pain control-d/c'd robaxin 1/21, continue HC 5-325 1-2 tabs q6 prn pain  -Continue PT/OT at skilled nursing facility -DVT prophylaxis as above, aspirin 325 mg CDIFF +  -WBC 17.5, had sputum  -CXR 1/19 suggested PNA, DDx atelcatasis  -added IS/Flutter valve, PRN q 6 nebs given prior smoking h/o  -OOB with meals  -Patient is afebrile, Unlikely has both 1/19 Pneumonia and C.diff  -Rx Vancomycin 14 days, stop date 03/05/13  Frequent falls  -Suspect a large component of this is related to his severe cognitive impairment from chronic alcohol abuse.  -TSH 0.676, B12 366, Folate >20, RPR negative  -Needs continued PT  -Will need home safety eval prior to discharge from facility -Consider neurology input in the distant future Prior history of Alcohol abuse  -According to family, patient has been sober for over one year  -Will continue thiamine, multivitamin, and folate.  Acute kidney Injury  -Resolved, Cr improving ikely secondary to vulome depletion  Anemia, normacytic  -Hemoglobin on discharge 8.0, likely dilutional iatrogenic blood loss component as well given was on IV fluids -Anemia panel: Iron 13, ferritin 1838  -AST 65, ALT 40, Total bili 0.5, unlikely from liver pathology, will need outpatient follow up  COPD  -appears compensated  -Continue dulera  -will order incentive spirometry  History of head trauma and anxiety  -Will continue xanax  -Consider slow taper and discontinuation of the same as an outpatient -Consider BuSpar?  Procedures:   as  above   Consultations:   as above   Discharge Exam: Filed Vitals:   02/20/13 0732  BP:   Pulse:   Temp:   Resp: 16    alert pleasant oriented no further stools today Tolerating diet and having full breakfast No nausea no shortness of breath  Uses chronic oxygen at home   General:  EOMI, NCAT  Cardiovascular:  S1-S2 no murmur rub or gallop  Respiratory:   clinically clear   Discharge Instructions  Discharge Orders   Future Appointments Provider Department Dept Phone   02/25/2013 3:00 PM Michele Mcalpine, MD [facility] [phone]   Future Orders Complete By Expires   Diet - low sodium heart healthy  As directed    Discharge instructions  As directed    Increase activity slowly  As directed        Medication List         acetaminophen 325 MG tablet  Commonly known as:  TYLENOL  Take 2 tablets (650 mg total) by mouth every 4 (four) hours as needed for mild pain, moderate pain or headache.     albuterol 0.63 MG/3ML nebulizer solution  Commonly known as:  ACCUNEB  Take 1 ampule by nebulization 4 (four) times daily.     ALPRAZolam 0.25 MG tablet  Commonly known as:  XANAX  Take 1 tablet (0.25 mg total) by mouth 2 (two) times daily as needed for anxiety.     aspirin EC 325 MG tablet  Take 1 tablet (325 mg total) by mouth daily.     aspirin EC 81 MG tablet  Take 1 tablet (81 mg total) by mouth daily.     dextromethorphan-guaiFENesin 10-100 MG/5ML liquid  Commonly known as:  ROBITUSSIN-DM  Take 15 mLs by mouth 4 (four) times daily.     docusate sodium 100 MG capsule  Commonly known as:  COLACE  Take 1 capsule (100 mg total) by mouth 2 (two) times daily. Continue this while taking narcotics to help with bowel movements     Fluticasone-Salmeterol 250-50 MCG/DOSE Aepb  Commonly known as:  ADVAIR  Inhale 1 puff into the lungs 2 (two) times daily.     HYDROcodone-acetaminophen 5-325 MG per tablet  Commonly known as:  NORCO  Take 2 tablets by mouth every 4 (four) hours as needed.     ipratropium 0.02 % nebulizer solution  Commonly known as:  ATROVENT  Take 0.5 mg by nebulization 4 (four) times daily.     ipratropium-albuterol 0.5-2.5 (3) MG/3ML Soln  Commonly known as:  DUONEB  Take 3 mLs by nebulization every 4 (four) hours as needed.     levofloxacin 500 MG tablet  Commonly known as:  LEVAQUIN  Take 500 mg by  mouth daily.     methylPREDNISolone 4 MG tablet  Commonly known as:  MEDROL  Take 2 mg by mouth daily.     methylPREDNISolone 2 MG tablet  Commonly known as:  MEDROL  Take 0.5 tablets (1 mg total) by mouth daily.     MUCINEX 600 MG 12 hr tablet  Generic drug:  guaiFENesin  Take 600 mg by mouth 2 (two) times daily as needed for cough.     multivitamin with minerals Tabs tablet  Take 1 tablet by mouth daily.     predniSONE 10 MG tablet  Commonly known as:  DELTASONE  Take 1 tablet (10 mg total) by mouth daily with breakfast. For 2 days     sertraline 100 MG tablet  Commonly known as:  ZOLOFT  Take 1 tablet (100 mg total) by mouth daily.     vancomycin 50 mg/mL oral solution  Commonly known as:  VANCOCIN  Take 2.5 mLs (125 mg total) by mouth 4 (four) times daily.       Allergies  Allergen Reactions  . Neomycin-Bacitracin Zn-Polymyx Rash       Follow-up Information   Follow up with MURPHY, TIMOTHY, D, MD. Schedule an appointment as soon as possible for a visit in 2 weeks.   Specialty:  Orthopedic Surgery   Contact information:   [address] [place] [address] [phone]        The results of significant diagnostics from this hospitalization (including imaging, microbiology, ancillary and laboratory) are listed below for reference.    Significant Diagnostic Studies: Dg Chest 1 View  02/17/2013   CLINICAL DATA:  Leukocytosis.  EXAM: CHEST - 1 VIEW  COMPARISON:  Chest radiograph 02/13/2013.  FINDINGS: Stable cardiac and mediastinal contours. Persistent diffuse bilateral coarse interstitial pulmonary opacities. There is more superimposed opacity within the right greater than left lung bases when compared to priors. Blunting of the bilateral costophrenic angles. No definite pneumothorax.  IMPRESSION: Increased heterogeneous opacities within the bilateral mid and lower lungs are favored to represent acute process such as pneumonia, potentially  atypical infectious etiology, or edema superimposed upon chronic fibrosis. Recommend continued radiographic followup to ensure resolution.   Electronically Signed   By: Annia Belt M.D.   On: 02/17/2013 08:06   Dg Chest 1 View  02/13/2013   CLINICAL DATA:  Pain post trauma  EXAM: CHEST - 1 VIEW  COMPARISON:  January 14, 2013  FINDINGS: Extensive fibrotic change throughout the mid and lower lung zones remains. There is milder scarring in the upper lobes bilaterally. There is no frank edema or consolidation. Heart is upper normal in size with normal pulmonary vascularity. No adenopathy. There is atherosclerotic change in aorta. No pneumothorax. 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 No acute fracture seen.  IMPRESSION: Extensive fibrotic change in mid lower lung zones bilaterally. No frank edema or consolidation.   Electronically Signed   By: Bretta Bang M.D.   On: 02/13/2013 11:02   Dg Hip Complete Left  02/13/2013   CLINICAL DATA:  Pain post trauma  EXAM: LEFT HIP - COMPLETE 2+ VIEW  COMPARISON:  None.  FINDINGS: Frontal pelvis as well as frontal and lateral left hip images were obtained. There is a comminuted intertrochanteric femur fracture on the left with varus angulation at the fracture site. The lesser trochanter is avulsed medially. No other fractures. No dislocation. There is mild symmetric narrowing of both hip joints.  IMPRESSION: Comminuted intertrochanteric femur fracture on the left with varus angulation at the fracture site.   Electronically Signed   By: Bretta Bang M.D.   On: 02/13/2013 11:01   Dg Hip Operative Left  02/14/2013   CLINICAL DATA:  Left femur fracture fixation.  EXAM: OPERATIVE LEFT HIP  COMPARISON:  Radiographs 02/13/2013.  FINDINGS: Three spot fluoroscopic images demonstrate dynamic screw and intra medullary rod fixation of the intertrochanteric left femur fracture. The main fracture fragments appear  anatomically reduced. The distal end of the intra medullary nail is not imaged.  IMPRESSION: Near anatomic reduction of intertrochanteric femur fracture post ORIF.   Electronically Signed   By: Roxy Horseman M.D.   On: 02/14/2013 17:41   Dg Femur Left  02/15/2013   CLINICAL DATA:  Postoperative radiograph, status post intramedullary rod placement within the left femur.  EXAM: LEFT FEMUR - 2 VIEW  COMPARISON:  Left hip radiographs performed 02/14/2012  FINDINGS: There has been successful placement of an intramedullary rod within the femur, transfixing the patient's intertrochanteric fracture in grossly anatomic alignment. A displaced lesser trochanteric fragment is again seen. No new fractures are identified.  The left femoral head remains seated at the acetabulum. Chronic subcortical cystic change is noted at the left lateral femoral condyle. The knee joint is otherwise grossly unremarkable in appearance, aside from a small knee joint effusion.  IMPRESSION: 1. Successful placement of intramedullary rod within the left femur, transfixing the intertrochanteric fracture in grossly anatomic alignment. Displaced lesser trochanteric fragment again seen. No new fractures identified. 2. Small knee joint effusion noted.   Electronically Signed   By: Roanna Raider M.D.   On: 02/15/2013 02:08    Microbiology: Recent Results (from the past 240 hour(s))  CULTURE, BLOOD (ROUTINE X 2)     Status: None   Collection Time    02/17/13  8:21 AM      Result Value Range Status   Specimen Description BLOOD RIGHT HAND   Final   Special Requests BOTTLES DRAWN AEROBIC ONLY 2CC   Final   Culture  Setup Time     Final   Value: 02/17/2013 12:49     Performed at [facility]   Culture     Final   Value:        BLOOD CULTURE RECEIVED NO GROWTH TO DATE CULTURE WILL BE HELD FOR 5 DAYS BEFORE ISSUING A FINAL NEGATIVE REPORT     Performed at [facility]   Report Status PENDING   Incomplete  CULTURE, BLOOD  (ROUTINE X 2)     Status: None   Collection Time    02/17/13 12:00 PM      Result Value Range Status   Specimen Description BLOOD RIGHT HAND   Final   Special Requests BOTTLES DRAWN AEROBIC ONLY 5CC   Final   Culture  Setup Time     Final   Value: 02/17/2013 16:07     Performed at [facility]   Culture     Final   Value:        BLOOD CULTURE RECEIVED NO GROWTH TO DATE CULTURE WILL BE HELD FOR 5 DAYS BEFORE ISSUING A FINAL NEGATIVE REPORT     Performed at [facility]   Report Status PENDING   Incomplete  CLOSTRIDIUM DIFFICILE BY PCR     Status: Abnormal   Collection Time    02/19/13 10:28 AM      Result Value Range Status   C difficile by pcr POSITIVE (*) NEGATIVE Final   Comment: CRITICAL RESULT CALLED TO, READ BACK BY AND VERIFIED WITH:     [facility] RN 12:10 02/19/13 ([email or web address])     Labs: Basic Metabolic Panel:  Recent Labs Lab 02/13/13 1222 02/14/13 0410 02/15/13 0410 02/18/13 0517  NA 142 144 146 140  K 5.0 4.5 4.8 4.6  CL 103 107 109 106  CO2 20 20 22 22   GLUCOSE 102* 115* 183* 102*  BUN 61* 51* 32* 28*  CREATININE 1.45* 1.26 0.96 1.01  CALCIUM 9.3 8.8 8.6 7.7*   Liver Function Tests:  Recent Labs Lab 02/16/13 1149  AST 65*  ALT 40  ALKPHOS 43  BILITOT 0.5  PROT 6.5  ALBUMIN 1.9*   No  results found for this basename: LIPASE, AMYLASE,  in the last 168 hours No results found for this basename: AMMONIA,  in the last 168 hours CBC:  Recent Labs Lab 02/13/13 1222  02/16/13 0500 02/17/13 0445 02/18/13 0517 02/19/13 0520 02/20/13 0443  WBC 11.8*  < > 12.5* 16.3* 23.2* 17.5* 14.3*  NEUTROABS 9.3*  --   --   --   --   --  12.2*  HGB 10.1*  < > 8.3* 9.1* 8.3* 8.4* 8.0*  HCT 29.5*  < > 25.6* 27.4* 25.3* 26.1* 24.8*  MCV 83.6  < > 85.6 84.8 84.9 85.9 84.9  PLT 302  < > 321 360 353 375 371  < > = values in this interval not displayed. Cardiac Enzymes: No results found for this basename: CKTOTAL, CKMB, CKMBINDEX, TROPONINI,  in the last  168 hours BNP: BNP (last 3 results) No results found for this basename: PROBNP,  in the last 8760 hours CBG:  Recent Labs Lab 02/17/13 2211  GLUCAP 112*       SignedRhetta Mura  [facility] 02/20/2013, 9:11 AM

## 2013-02-23 LAB — CULTURE, BLOOD (ROUTINE X 2)
CULTURE: NO GROWTH
Culture: NO GROWTH

## 2013-02-25 ENCOUNTER — Ambulatory Visit: Payer: Medicare Other | Admitting: Pulmonary Disease

## 2013-03-30 NOTE — Progress Notes (Signed)
Clinical social worker assisted with patient discharge to skilled nursing facility, [facility].  CSW addressed all family questions and concerns. CSW copied chart and added all important documents. CSW also set up patient transportation with [facility]. Clinical Social Worker will sign off for now as social work intervention is no longer needed.   Jora Galluzzo, MSW, LCSWA [phone] 

## 2014-02-03 ENCOUNTER — Other Ambulatory Visit: Payer: Self-pay | Admitting: Pulmonary Disease

## 2014-07-07 ENCOUNTER — Ambulatory Visit: Payer: Medicare Other | Admitting: Adult Health

## 2014-07-13 ENCOUNTER — Ambulatory Visit: Payer: Medicare Other | Admitting: Adult Health

## 2014-07-31 ENCOUNTER — Encounter (HOSPITAL_COMMUNITY): Payer: Self-pay | Admitting: Emergency Medicine

## 2014-07-31 ENCOUNTER — Emergency Department (HOSPITAL_COMMUNITY): Payer: Medicare Other

## 2014-07-31 ENCOUNTER — Emergency Department (HOSPITAL_COMMUNITY)
Admission: EM | Admit: 2014-07-31 | Discharge: 2014-07-31 | Disposition: A | Discharge status: Skilled Nursing Facility | Payer: Medicare Other | Attending: Emergency Medicine | Admitting: Emergency Medicine

## 2014-07-31 DIAGNOSIS — Z7982 Long term (current) use of aspirin: Secondary | ICD-10-CM | POA: Insufficient documentation

## 2014-07-31 DIAGNOSIS — Z7951 Long term (current) use of inhaled steroids: Secondary | ICD-10-CM | POA: Diagnosis not present

## 2014-07-31 DIAGNOSIS — Z79899 Other long term (current) drug therapy: Secondary | ICD-10-CM | POA: Insufficient documentation

## 2014-07-31 DIAGNOSIS — S0121XA Laceration without foreign body of nose, initial encounter: Secondary | ICD-10-CM | POA: Insufficient documentation

## 2014-07-31 DIAGNOSIS — Y998 Other external cause status: Secondary | ICD-10-CM | POA: Insufficient documentation

## 2014-07-31 DIAGNOSIS — S0990XA Unspecified injury of head, initial encounter: Secondary | ICD-10-CM

## 2014-07-31 DIAGNOSIS — W06XXXA Fall from bed, initial encounter: Secondary | ICD-10-CM | POA: Diagnosis not present

## 2014-07-31 DIAGNOSIS — Y9389 Activity, other specified: Secondary | ICD-10-CM | POA: Insufficient documentation

## 2014-07-31 DIAGNOSIS — H269 Unspecified cataract: Secondary | ICD-10-CM | POA: Insufficient documentation

## 2014-07-31 DIAGNOSIS — Z87891 Personal history of nicotine dependence: Secondary | ICD-10-CM | POA: Insufficient documentation

## 2014-07-31 DIAGNOSIS — G319 Degenerative disease of nervous system, unspecified: Secondary | ICD-10-CM | POA: Insufficient documentation

## 2014-07-31 DIAGNOSIS — I2510 Atherosclerotic heart disease of native coronary artery without angina pectoris: Secondary | ICD-10-CM | POA: Diagnosis not present

## 2014-07-31 DIAGNOSIS — J449 Chronic obstructive pulmonary disease, unspecified: Secondary | ICD-10-CM | POA: Insufficient documentation

## 2014-07-31 DIAGNOSIS — S0181XA Laceration without foreign body of other part of head, initial encounter: Secondary | ICD-10-CM | POA: Insufficient documentation

## 2014-07-31 DIAGNOSIS — Y92121 Bathroom in nursing home as the place of occurrence of the external cause: Secondary | ICD-10-CM | POA: Diagnosis not present

## 2014-07-31 DIAGNOSIS — F419 Anxiety disorder, unspecified: Secondary | ICD-10-CM | POA: Insufficient documentation

## 2014-07-31 DIAGNOSIS — R9082 White matter disease, unspecified: Secondary | ICD-10-CM | POA: Insufficient documentation

## 2014-07-31 MED ORDER — LIDOCAINE HCL 1 % IJ SOLN
30.0000 mL | Freq: Once | INTRAMUSCULAR | Status: AC
  Administered 2014-07-31: 30 mL
  Filled 2014-07-31: qty 40

## 2014-07-31 MED ORDER — ALBUTEROL SULFATE HFA 108 (90 BASE) MCG/ACT IN AERS
2.0000 | INHALATION_SPRAY | Freq: Once | RESPIRATORY_TRACT | Status: AC
  Administered 2014-07-31: 2 via RESPIRATORY_TRACT
  Filled 2014-07-31: qty 6.7

## 2014-07-31 MED ORDER — LIDOCAINE-EPINEPHRINE 2 %-1:100000 IJ SOLN
20.0000 mL | Freq: Once | INTRAMUSCULAR | Status: DC
  Filled 2014-07-31: qty 1

## 2014-07-31 NOTE — ED Notes (Signed)
Bed: [id number] Expected date:  Expected time:  Means of arrival:  Comments: EMS 74 yo male from SNF-fell out of bed/head lac-no LOC

## 2014-07-31 NOTE — ED Provider Notes (Signed)
TIME SEEN: 4:00 PM  CHIEF COMPLAINT: Fall, head injury  HPI: Pt is a 74 y.o. male with history of COPD, CAD who lives in a nursing home who had a fall out of bed today. Has a for similar laceration to the left temporal region and a 1 cm laceration to the bridge of the nose. Also has multiple skin tears to the left upper extremity. Denies any pain. Not on anticoagulation. No numbness, tingling or focal weakness.  ROS: See HPI Constitutional: no fever  Eyes: no drainage  ENT: no runny nose   Cardiovascular:  no chest pain  Resp: no SOB  GI: no vomiting GU: no dysuria Integumentary: no rash  Allergy: no hives  Musculoskeletal: no leg swelling  Neurological: no slurred speech ROS otherwise negative  PAST MEDICAL HISTORY/PAST SURGICAL HISTORY:  Past Medical History  Diagnosis Date  . Bilateral senile cataracts   . Macular degeneration, bilateral   . COPD (chronic obstructive pulmonary disease)   . Cigarette smoker   . CAD (coronary artery disease)     ?  Marland Kitchen Peripheral vascular disease   . Alcohol abuse   . Head trauma     closed  . Neuropathy   . Abnormality of gait   . Anxiety   . Shortness of breath     MEDICATIONS:  Prior to Admission medications   Medication Sig Start Date End Date Taking? Authorizing Provider  acetaminophen (TYLENOL) 325 MG tablet Take 2 tablets (650 mg total) by mouth every 4 (four) hours as needed for mild pain, moderate pain or headache. 12/16/12   Christiane Ha, MD  albuterol (ACCUNEB) 0.63 MG/3ML nebulizer solution Take 1 ampule by nebulization 4 (four) times daily. 12/16/12   Christiane Ha, MD  ALPRAZolam Prudy Feeler) 0.25 MG tablet Take 1 tablet (0.25 mg total) by mouth 2 (two) times daily as needed for anxiety. 02/20/13   Rhetta Mura, MD  aspirin EC 325 MG EC tablet Take 1 tablet (325 mg total) by mouth daily with breakfast. 02/20/13   Rhetta Mura, MD  aspirin EC 325 MG tablet Take 1 tablet (325 mg total) by mouth daily. 02/14/13    Sheral Apley, MD  dextromethorphan-guaiFENesin (ROBITUSSIN-DM) 10-100 MG/5ML liquid Take 15 mLs by mouth 4 (four) times daily.    Historical Provider, MD  docusate sodium (COLACE) 100 MG capsule Take 1 capsule (100 mg total) by mouth 2 (two) times daily. Continue this while taking narcotics to help with bowel movements 02/14/13   Sheral Apley, MD  Fluticasone-Salmeterol (ADVAIR) 250-50 MCG/DOSE AEPB Inhale 1 puff into the lungs 2 (two) times daily.    Historical Provider, MD  guaiFENesin (MUCINEX) 600 MG 12 hr tablet Take 600 mg by mouth 2 (two) times daily as needed for cough.     Historical Provider, MD  HYDROcodone-acetaminophen (NORCO) 5-325 MG per tablet Take 2 tablets by mouth every 4 (four) hours as needed. 02/20/13   Rhetta Mura, MD  ipratropium (ATROVENT) 0.02 % nebulizer solution Take 0.5 mg by nebulization 4 (four) times daily.    Historical Provider, MD  ipratropium-albuterol (DUONEB) 0.5-2.5 (3) MG/3ML SOLN Take 3 mLs by nebulization every 4 (four) hours as needed. 02/20/13   Rhetta Mura, MD  Multiple Vitamin (MULTIVITAMIN WITH MINERALS) TABS tablet Take 1 tablet by mouth daily.    Historical Provider, MD  sertraline (ZOLOFT) 100 MG tablet Take 1 tablet (100 mg total) by mouth daily. 01/14/13   Julio Sicks, NP    ALLERGIES:  Allergies  Allergen Reactions  . Neomycin-Bacitracin Zn-Polymyx Rash    SOCIAL HISTORY:  History  Substance Use Topics  . Smoking status: Former Smoker -- 0.50 packs/day for 52 years    Types: Cigarettes    Start date: 12/16/2012  . Smokeless tobacco: Never Used     Comment: 1 pack every 2 days  . Alcohol Use: 2.0 oz/week    4 drink(s) per week    FAMILY HISTORY: Family History  Problem Relation Age of Onset  . Asthma Brother   . Asthma Brother   . Asthma Brother   . Emphysema Brother   . Diabetes Mother   . Arthritis Mother   . Heart disease Mother   . Diabetes Brother   . Breast cancer Sister     EXAM: BP  141/61 mmHg  Pulse 67  Temp(Src) 98.3 F (36.8 C) (Oral)  SpO2 94% CONSTITUTIONAL: Alert and oriented and responds appropriately to questions. Well-appearing; well-nourished; GCS 15 HEAD: Normocephalic; atraumatic EYES: Conjunctivae clear, PERRL, EOMI ENT: normal nose; no rhinorrhea; moist mucous membranes; pharynx without lesions noted; no dental injury; no septal hematoma NECK: Supple, no meningismus, no LAD; no midline spinal tenderness, step-off or deformity CARD: RRR; S1 and S2 appreciated; no murmurs, no clicks, no rubs, no gallops RESP: Normal chest excursion without splinting or tachypnea; breath sounds clear and equal bilaterally; no wheezes, no rhonchi, no rales; no hypoxia or respiratory distress CHEST:  chest wall stable, no crepitus or ecchymosis or deformity, nontender to palpation ABD/GI: Normal bowel sounds; non-distended; soft, non-tender, no rebound, no guarding PELVIS:  stable, nontender to palpation BACK:  The back appears normal and is non-tender to palpation, there is no CVA tenderness; no midline spinal tenderness, step-off or deformity EXT: Normal ROM in all joints; non-tender to palpation; no edema; normal capillary refill; no cyanosis, no bony tenderness or bony deformity of patient's extremities, no joint effusion, no ecchymosis, patient has a for similar laceration to the left temple and a 1 cm laceration to the bridge of the nose SKIN: Normal color for age and race; warm, skin tears to the dorsal left forearm NEURO: Moves all extremities equally, sensation to light touch intact diffusely, cranial nerves II through XII intact PSYCH: The patient's mood and manner are appropriate. Grooming and personal hygiene are appropriate.  MEDICAL DECISION MAKING: Patient here with fall as needed. Lacerations repaired by Elpidio AnisShari Upstill.  Patient reports tetanus vaccination was updated several months ago. CT of his head and cervical spine show no abnormality. I feel he is safe to be  discharged home. States he just lost his balance when he was trying to get up out of bed today. No other preceding symptoms. No complaints currently.      Layla MawKristen N Malikiah Debarr, DO 07/31/14 [phone]

## 2014-07-31 NOTE — ED Notes (Signed)
Patient fell out of bed. Patient is suppose to get up with assistance. Patient got up by hisself and hit his forehead on the metal rail. Patient has a laceration on leftside of head. Patient has skin tears on left arm and patient has shingles on back and chest.

## 2014-07-31 NOTE — ED Notes (Signed)
Pt states he was trying to get something off the floor when he fell out the bed hitting L side of forehead, pt denies pain, just states "I hurt all over", pt has large laceration to L forehead, small laceration to nose, 3 skin tears to L arm, wounds cleaned, 3 skin tears covered. Laceration to forehead has minimal bleeding still.

## 2014-07-31 NOTE — ED Notes (Signed)
Pt also has scabs to R side of upper back from shingles, pt states he has had them x 1 week.

## 2014-07-31 NOTE — Discharge Instructions (Signed)
Facial Laceration ° A facial laceration is a cut on the face. These injuries can be painful and cause bleeding. Lacerations usually heal quickly, but they need special care to reduce scarring. °DIAGNOSIS  °Your health care provider will take a medical history, ask for details about how the injury occurred, and examine the wound to determine how deep the cut is. °TREATMENT  °Some facial lacerations may not require closure. Others may not be able to be closed because of an increased risk of infection. The risk of infection and the chance for successful closure will depend on various factors, including the amount of time since the injury occurred. °The wound may be cleaned to help prevent infection. If closure is appropriate, pain medicines may be given if needed. Your health care provider will use stitches (sutures), wound glue (adhesive), or skin adhesive strips to repair the laceration. These tools bring the skin edges together to allow for faster healing and a better cosmetic outcome. If needed, you may also be given a tetanus shot. °HOME CARE INSTRUCTIONS °· Only take over-the-counter or prescription medicines as directed by your health care provider. °· Follow your health care provider's instructions for wound care. These instructions will vary depending on the technique used for closing the wound. °For Sutures: °· Keep the wound clean and dry.   °· If you were given a bandage (dressing), you should change it at least once a day. Also change the dressing if it becomes wet or dirty, or as directed by your health care provider.   °· Wash the wound with soap and water 2 times a day. Rinse the wound off with water to remove all soap. Pat the wound dry with a clean towel.   °· After cleaning, apply a thin layer of the antibiotic ointment recommended by your health care provider. This will help prevent infection and keep the dressing from sticking.   °· You may shower as usual after the first 24 hours. Do not soak the  wound in water until the sutures are removed.   °· Get your sutures removed as directed by your health care provider. With facial lacerations, sutures should usually be taken out after 4-5 days to avoid stitch marks.   °· Wait a few days after your sutures are removed before applying any makeup. °For Skin Adhesive Strips: °· Keep the wound clean and dry.   °· Do not get the skin adhesive strips wet. You may bathe carefully, using caution to keep the wound dry.   °· If the wound gets wet, pat it dry with a clean towel.   °· Skin adhesive strips will fall off on their own. You may trim the strips as the wound heals. Do not remove skin adhesive strips that are still stuck to the wound. They will fall off in time.   °For Wound Adhesive: °· You may briefly wet your wound in the shower or bath. Do not soak or scrub the wound. Do not swim. Avoid periods of heavy sweating until the skin adhesive has fallen off on its own. After showering or bathing, gently pat the wound dry with a clean towel.   °· Do not apply liquid medicine, cream medicine, ointment medicine, or makeup to your wound while the skin adhesive is in place. This may loosen the film before your wound is healed.   °· If a dressing is placed over the wound, be careful not to apply tape directly over the skin adhesive. This may cause the adhesive to be pulled off before the wound is healed.   °· Avoid   prolonged exposure to sunlight or tanning lamps while the skin adhesive is in place. °· The skin adhesive will usually remain in place for 5-10 days, then naturally fall off the skin. Do not pick at the adhesive film.   °After Healing: °Once the wound has healed, cover the wound with sunscreen during the day for 1 full year. This can help minimize scarring. Exposure to ultraviolet light in the first year will darken the scar. It can take 1-2 years for the scar to lose its redness and to heal completely.  °SEEK IMMEDIATE MEDICAL CARE IF: °· You have redness, pain, or  swelling around the wound.   °· You see a yellowish-white fluid (pus) coming from the wound.   °· You have chills or a fever.   °MAKE SURE YOU: °· Understand these instructions. °· Will watch your condition. °· Will get help right away if you are not doing well or get worse. °Document Released: 02/24/2004 Document Revised: 11/06/2012 Document Reviewed: 08/29/2012 °ExitCare® Patient Information ©2015 [employer]. This information is not intended to replace advice given to you by your health care provider. Make sure you discuss any questions you have with your health care provider. °Head Injury °You have received a head injury. It does not appear serious at this time. Headaches and vomiting are common following head injury. It should be easy to awaken from sleeping. Sometimes it is necessary for you to stay in the emergency department for a while for observation. Sometimes admission to the hospital may be needed. After injuries such as yours, most problems occur within the first 24 hours, but side effects may occur up to 7-10 days after the injury. It is important for you to carefully monitor your condition and contact your health care provider or seek immediate medical care if there is a change in your condition. °WHAT ARE THE TYPES OF HEAD INJURIES? °Head injuries can be as minor as a bump. Some head injuries can be more severe. More severe head injuries include: °· A jarring injury to the brain (concussion). °· A bruise of the brain (contusion). This mean there is bleeding in the brain that can cause swelling. °· A cracked skull (skull fracture). °· Bleeding in the brain that collects, clots, and forms a bump (hematoma). °WHAT CAUSES A HEAD INJURY? °A serious head injury is most likely to happen to someone who is in a car wreck and is not wearing a seat belt. Other causes of major head injuries include bicycle or motorcycle accidents, sports injuries, and falls. °HOW ARE HEAD INJURIES DIAGNOSED? °A complete  history of the event leading to the injury and your current symptoms will be helpful in diagnosing head injuries. Many times, pictures of the brain, such as CT or MRI are needed to see the extent of the injury. Often, an overnight hospital stay is necessary for observation.  °WHEN SHOULD I SEEK IMMEDIATE MEDICAL CARE?  °You should get help right away if: °· You have confusion or drowsiness. °· You feel sick to your stomach (nauseous) or have continued, forceful vomiting. °· You have dizziness or unsteadiness that is getting worse. °· You have severe, continued headaches not relieved by medicine. Only take over-the-counter or prescription medicines for pain, fever, or discomfort as directed by your health care provider. °· You do not have normal function of the arms or legs or are unable to walk. °· You notice changes in the black spots in the center of the colored part of your eye (pupil). °· You have a clear or   bloody fluid coming from your nose or ears. °· You have a loss of vision. °During the next 24 hours after the injury, you must stay with someone who can watch you for the warning signs. This person should contact local emergency services (911 in the [place] if you have seizures, you become unconscious, or you are unable to wake up. °HOW CAN I PREVENT A HEAD INJURY IN THE FUTURE? °The most important factor for preventing major head injuries is avoiding motor vehicle accidents.  To minimize the potential for damage to your head, it is crucial to wear seat belts while riding in motor vehicles. Wearing helmets while bike riding and playing collision sports (like football) is also helpful. Also, avoiding dangerous activities around the house will further help reduce your risk of head injury.  °WHEN CAN I RETURN TO NORMAL ACTIVITIES AND ATHLETICS? °You should be reevaluated by your health care provider before returning to these activities. If you have any of the following symptoms, you should not return to  activities or contact sports until 1 week after the symptoms have stopped: °· Persistent headache. °· Dizziness or vertigo. °· Poor attention and concentration. °· Confusion. °· Memory problems. °· Nausea or vomiting. °· Fatigue or tire easily. °· Irritability. °· Intolerant of bright lights or loud noises. °· Anxiety or depression. °· Disturbed sleep. °MAKE SURE YOU:  °· Understand these instructions. °· Will watch your condition. °· Will get help right away if you are not doing well or get worse. °Document Released: 01/16/2005 Document Revised: 01/21/2013 Document Reviewed: 09/23/2012 °ExitCare® Patient Information ©2015 [employer]. This information is not intended to replace advice given to you by your health care provider. Make sure you discuss any questions you have with your health care provider. ° °

## 2014-07-31 NOTE — ED Provider Notes (Signed)
LACERATION REPAIR Performed by: Elpidio AnisUPSTILL, Rainn Zupko A Authorized by: Elpidio AnisUPSTILL, Endia Moncur A Consent: Verbal consent obtained. Risks and benefits: risks, benefits and alternatives were discussed Consent given by: patient Patient identity confirmed: provided demographic data Prepped and Draped in normal sterile fashion Wound explored; moderate wound margin debridement performed  Laceration Location: left forehead  Laceration Length: 4 cm  No Foreign Bodies seen or palpated  Anesthesia: local infiltration  Local anesthetic: lidocaine 2% w/o epinephrine  Anesthetic total: 3 ml  Irrigation method: syringe Amount of cleaning: standard  Skin closure: 6-0 prolene  Number of sutures: 9  Technique: running  Patient tolerance: Patient tolerated the procedure well with no immediate complications.  LACERATION REPAIR Performed by: Elpidio AnisUPSTILL, Shaleigh Laubscher A Authorized by: Elpidio AnisUPSTILL, Rushawn Capshaw A Consent: Verbal consent obtained. Risks and benefits: risks, benefits and alternatives were discussed Consent given by: patient Patient identity confirmed: provided demographic data Prepped and Draped in normal sterile fashion Wound explored  Laceration Location: nose  Laceration Length: 1 cm  No Foreign Bodies seen or palpated  Anesthesia: local infiltration  Local anesthetic: lidocaine 2% w/o epinephrine  Anesthetic total: 1 ml  Irrigation method: syringe Amount of cleaning: standard  Skin closure: 6-0   Number of sutures: 2  Technique: simple interrupted  Patient tolerance: Patient tolerated the procedure well with no immediate complications.    Elpidio AnisShari Zandria Woldt, PA-C 07/31/14 [phone]

## 2014-07-31 NOTE — ED Notes (Signed)
[facility] called to transport pt back to [facility]

## 2014-07-31 NOTE — ED Notes (Signed)
Patient transported to CT 

## 2016-03-26 ENCOUNTER — Encounter (HOSPITAL_COMMUNITY): Payer: Self-pay | Admitting: Emergency Medicine

## 2016-03-26 ENCOUNTER — Emergency Department (HOSPITAL_COMMUNITY)
Admission: EM | Admit: 2016-03-26 | Discharge: 2016-03-26 | Disposition: A | Discharge status: Home / Self Care | Payer: Medicare Other | Attending: Emergency Medicine | Admitting: Emergency Medicine

## 2016-03-26 DIAGNOSIS — G9340 Encephalopathy, unspecified: Secondary | ICD-10-CM

## 2016-03-26 DIAGNOSIS — Z87891 Personal history of nicotine dependence: Secondary | ICD-10-CM | POA: Insufficient documentation

## 2016-03-26 DIAGNOSIS — R55 Syncope and collapse: Secondary | ICD-10-CM | POA: Diagnosis present

## 2016-03-26 DIAGNOSIS — I2510 Atherosclerotic heart disease of native coronary artery without angina pectoris: Secondary | ICD-10-CM | POA: Diagnosis not present

## 2016-03-26 DIAGNOSIS — Z7982 Long term (current) use of aspirin: Secondary | ICD-10-CM | POA: Insufficient documentation

## 2016-03-26 DIAGNOSIS — Z515 Encounter for palliative care: Secondary | ICD-10-CM

## 2016-03-26 DIAGNOSIS — J9621 Acute and chronic respiratory failure with hypoxia: Secondary | ICD-10-CM | POA: Diagnosis not present

## 2016-03-26 DIAGNOSIS — G0430 Acute necrotizing hemorrhagic encephalopathy, unspecified: Secondary | ICD-10-CM | POA: Diagnosis not present

## 2016-03-26 DIAGNOSIS — J449 Chronic obstructive pulmonary disease, unspecified: Secondary | ICD-10-CM | POA: Insufficient documentation

## 2016-03-26 MED ORDER — LORAZEPAM 1 MG PO TABS: 1.0000 mg | ORAL_TABLET | ORAL | Status: DC | PRN

## 2016-03-26 MED ORDER — HALOPERIDOL LACTATE 5 MG/ML IJ SOLN: 0.5000 mg | INTRAMUSCULAR | Status: DC | PRN

## 2016-03-26 MED ORDER — HYDROMORPHONE HCL 2 MG/ML IJ SOLN: 0.5000 mg | INTRAMUSCULAR | Status: DC | PRN

## 2016-03-26 MED ORDER — LORAZEPAM 2 MG/ML IJ SOLN: 1.0000 mg | INTRAMUSCULAR | Status: DC | PRN

## 2016-03-26 MED ORDER — LORAZEPAM 2 MG/ML PO CONC: 1.0000 mg | ORAL | Status: DC | PRN

## 2016-03-26 MED ORDER — HALOPERIDOL 1 MG PO TABS: 0.5000 mg | ORAL_TABLET | ORAL | Status: DC | PRN

## 2016-03-26 MED ORDER — HALOPERIDOL LACTATE 2 MG/ML PO CONC
0.5000 mg | ORAL | Status: DC | PRN
  Filled 2016-03-26: qty 0.3

## 2016-03-26 NOTE — ED Notes (Signed)
ED Provider at bedside. 

## 2016-03-26 NOTE — ED Notes (Signed)
Leaving with [facility] at this time. 

## 2016-03-26 NOTE — ED Notes (Signed)
Waiting for equipment to be brouoght to the home before the pt traNASFERS HOME.  [facility]

## 2016-03-26 NOTE — Progress Notes (Signed)
Chaplain responded to trauma, meeting daughter Pura SpiceDonna Jones, Donna's husband, and granddaughter, Theodoro DoingKrysta, in ED, and escorting them to be with patient.  Family appeared distressed and expressed [facility] with [facility] multiple occasions.  They became increasingly teary as they learned of the critical nature of his condition.  Daughter stated that she has been caring for her father for over 20 years and he is both like her father, but also like one of her children, as a result of these many years of caregiving.    Chaplain appreciated Dr. Garlan FillersKnott's empathetic words and calming tone with family.  Provided emotional support and offered ongoing services as needed.  Please call as needed.  Theodoro Parmaalacios, Macy Lingenfelter N, Chaplain [phone]    03/26/16 1451  Clinical Encounter Type  Visited With Patient and family together  Visit Type Initial;Critical Care  Referral From Physician  Consult/Referral To Chaplain  Stress Factors  Patient Stress Factors Health changes  Family Stress Factors Health changes;Lack of knowledge

## 2016-03-26 NOTE — ED Notes (Signed)
Patient suction thick brown secretion noted.

## 2016-03-26 NOTE — ED Notes (Signed)
The  Pt is being prepared to go home.  Arrangements may take hours before that occurs

## 2016-03-26 NOTE — ED Provider Notes (Signed)
[facility]-EMERGENCY DEPT Provider Note   CSN: [id number] Arrival date & time: 03/26/16  1416     History   Chief Complaint Chief Complaint  Patient presents with  . Loss of Consciousness    HPI Antonio Marshall is a 76 y.o. male.  The history is provided by the EMS personnel, the nursing home and a relative.  Altered Mental Status   This is a new problem. Episode onset: 1 week ago. The problem has been gradually worsening. Associated symptoms include confusion, somnolence, unresponsiveness and weakness. Past medical history comments: nursing facility placement.    Past Medical History:  Diagnosis Date  . Abnormality of gait   . Alcohol abuse   . Anxiety   . Bilateral senile cataracts   . CAD (coronary artery disease)    ?  Marland Kitchen. Cigarette smoker   . COPD (chronic obstructive pulmonary disease) (HCC)   . Head trauma    closed  . Macular degeneration, bilateral   . Neuropathy (HCC)   . Peripheral vascular disease (HCC)   . Shortness of breath     Patient Active Problem List   Diagnosis Date Noted  . Anemia 02/14/2013  . AKI (acute kidney injury) (HCC) 02/14/2013  . Hip fracture, intertrochanteric (HCC) 02/13/2013  . Frequent falls 02/13/2013  . Fracture of left hip (HCC) 02/13/2013  . Gait difficulty 12/11/2012  . Pneumonia 12/11/2012  . Hypercholesteremia 02/08/2011  . MACULAR DEGENERATION, BILATERAL 08/21/2008  . CATARACT, SENILE, BILATERAL 08/21/2008  . CONTUSION, RIB 05/20/2008  . ACUTE BRONCHITIS 03/31/2008  . HEAD TRAUMA, CLOSED 04/20/2007  . CIGARETTE SMOKER 04/09/2007  . PERIPHERAL VASCULAR DISEASE 04/09/2007  . ABNORMALITY OF GAIT 04/09/2007  . ANXIETY 11/13/2006  . ALCOHOL ABUSE 11/13/2006  . NEUROPATHY 11/13/2006  . VENOUS INSUFFICIENCY 11/13/2006  . COPD 11/13/2006    Past Surgical History:  Procedure Laterality Date  . APPENDECTOMY    . 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 FEMUR IM NAIL Left 02/14/2013   Procedure: INTRAMEDULLARY (IM) NAIL  FEMORAL;  Surgeon: Sheral Apleyimothy D Murphy, MD;  Location: [facility] OR;  Service: Orthopedics;  Laterality: Left;       Home Medications    Prior to Admission medications   Medication Sig Start Date End Date Taking? Authorizing Provider  albuterol (ACCUNEB) 0.63 MG/3ML nebulizer solution Take 1 ampule by nebulization 4 (four) times daily. 12/16/12  Yes Christiane Haorinna L Sullivan, MD  aspirin 81 MG chewable tablet Chew 81 mg by mouth daily.   Yes Historical Provider, MD  beta carotene w/minerals (OCUVITE) tablet Take 1 tablet by mouth daily.   Yes Historical Provider, MD  cholecalciferol (VITAMIN D) 1000 UNITS tablet Take 1,000 Units by mouth daily.   Yes Historical Provider, MD  dextromethorphan-guaiFENesin (ROBITUSSIN-DM) 10-100 MG/5ML liquid Take 15 mLs by mouth 4 (four) times daily.   Yes Historical Provider, MD  divalproex (DEPAKOTE) 500 MG DR tablet Take 500 mg by mouth 2 (two) times daily.   Yes Historical Provider, MD  docusate sodium (COLACE) 100 MG capsule Take 1 capsule (100 mg total) by mouth 2 (two) times daily. Continue this while taking narcotics to help with bowel movements 02/14/13  Yes Sheral Apleyimothy D Murphy, MD  donepezil (ARICEPT) 10 MG tablet Take 10 mg by mouth at bedtime.   Yes Historical Provider, MD  ENSURE (ENSURE) Take 237 mLs by mouth 2 (two) times daily between meals.   Yes Historical Provider, MD  ferrous sulfate 325 (65 FE) MG tablet Take 325 mg by  mouth daily with breakfast.   Yes Historical Provider, MD  Fluticasone-Salmeterol (ADVAIR) 250-50 MCG/DOSE AEPB Inhale 1 puff into the lungs 2 (two) times daily.   Yes Historical Provider, MD  HYDROcodone-acetaminophen (NORCO) 5-325 MG per tablet Take 2 tablets by mouth every 4 (four) hours as needed. 02/20/13  Yes Rhetta Mura, MD  Melatonin 1 MG TABS Take 2 tablets by mouth at bedtime.   Yes Historical Provider, MD  Memantine HCl ER (NAMENDA XR) 21 MG CP24 Take 1 capsule by mouth daily.   Yes Historical Provider, MD  methylPREDNISolone  (MEDROL) 4 MG tablet Take 2 mg by mouth daily. For COPD   Yes Historical Provider, MD  nitrofurantoin (MACRODANTIN) 50 MG capsule Take 50 mg by mouth at bedtime. For recurrent UTI   Yes Historical Provider, MD  OLANZapine (ZYPREXA) 2.5 MG tablet Take 2.5 mg by mouth at bedtime.   Yes Historical Provider, MD  omeprazole (PRILOSEC) 20 MG capsule Take 20 mg by mouth daily.   Yes Historical Provider, MD  sertraline (ZOLOFT) 100 MG tablet Take 1 tablet (100 mg total) by mouth daily. 01/14/13  Yes Tammy S Parrett, NP  acetaminophen (TYLENOL) 325 MG tablet Take 2 tablets (650 mg total) by mouth every 4 (four) hours as needed for mild pain, moderate pain or headache. 12/16/12   Christiane Ha, MD  ALPRAZolam Prudy Feeler) 0.25 MG tablet Take 1 tablet (0.25 mg total) by mouth 2 (two) times daily as needed for anxiety. 02/20/13   Rhetta Mura, MD  aspirin EC 325 MG EC tablet Take 1 tablet (325 mg total) by mouth daily with breakfast. Patient not taking: Reported on 07/31/2014 02/20/13   Rhetta Mura, MD  aspirin EC 325 MG tablet Take 1 tablet (325 mg total) by mouth daily. Patient not taking: Reported on 07/31/2014 02/14/13   Sheral Apley, MD  divalproex (DEPAKOTE SPRINKLE) 125 MG capsule Take 125 mg by mouth 3 (three) times daily.    Historical Provider, MD  guaiFENesin (MUCINEX) 600 MG 12 hr tablet Take 600 mg by mouth 2 (two) times daily as needed for cough.     Historical Provider, MD  ipratropium-albuterol (DUONEB) 0.5-2.5 (3) MG/3ML SOLN Take 3 mLs by nebulization every 4 (four) hours as needed. 02/20/13   Rhetta Mura, MD  Multiple Vitamin (MULTIVITAMIN WITH MINERALS) TABS tablet Take 1 tablet by mouth daily.    Historical Provider, MD  valACYclovir (VALTREX) 1000 MG tablet Take 1 tablet by mouth 2 (two) times daily. 07/27/14   Historical Provider, MD    Family History Family History  Problem Relation Age of Onset  . Asthma Brother   . Asthma Brother   . Asthma Brother   .  Emphysema Brother   . Diabetes Mother   . Arthritis Mother   . Heart disease Mother   . Diabetes Brother   . Breast cancer Sister     Social History Social History  Substance Use Topics  . Smoking status: Former Smoker    Packs/day: 0.50    Years: 52.00    Types: Cigarettes    Start date: 12/16/2012  . Smokeless tobacco: Never Used     Comment: 1 pack every 2 days  . Alcohol use 2.0 oz/week    4 Standard drinks or equivalent per week     Allergies   Neomycin-bacitracin zn-polymyx   Review of Systems Review of Systems  Unable to perform ROS: Mental status change  Neurological: Positive for weakness.  Psychiatric/Behavioral: Positive for confusion.  LEVEL 5 EXCEPTION TO HISTORY    Physical Exam Updated Vital Signs BP 106/68   Pulse 114   Temp 98.3 F (36.8 C) (Axillary)   Resp 21   SpO2 98%   Physical Exam  Constitutional: He appears lethargic. He appears cachectic.  Slumped forward, appears very weak and nonverbal which is a rapid decline from baseline per family members.  HENT:  Head: Normocephalic and atraumatic.  Nose: Nose normal.  Dried secretions and thick mucus suctioned from mouth and throat  Eyes: Conjunctivae are normal.  Neck: Neck supple. No tracheal deviation present.  Cardiovascular: Regular rhythm.  Tachycardia present.   Pulmonary/Chest: Tachypnea noted. No respiratory distress. He has rhonchi (coarse bilateral diffuse).  Abdominal: Soft. He exhibits no distension.  Neurological: He appears lethargic. He is disoriented.  Skin: Skin is warm and dry.   ED Treatments / Results  Labs (all labs ordered are listed, but only abnormal results are displayed) Labs Reviewed - No data to display  EKG  EKG Interpretation None       Radiology No results found.  Procedures Procedures (including critical care time)  Medications Ordered in ED Medications  haloperidol (HALDOL) tablet 0.5 mg (not administered)    Or  haloperidol (HALDOL)  2 MG/ML solution 0.5 mg (not administered)    Or  haloperidol lactate (HALDOL) injection 0.5 mg (not administered)  LORazepam (ATIVAN) tablet 1 mg (not administered)    Or  LORazepam (ATIVAN) 2 MG/ML concentrated solution 1 mg (not administered)    Or  LORazepam (ATIVAN) injection 1 mg (not administered)  HYDROmorphone (DILAUDID) injection 0.5 mg (not administered)     Initial Impression / Assessment and Plan / ED Course  I have reviewed the triage vital signs and the nursing notes.  Pertinent labs & imaging results that were available during my care of the patient were reviewed by me and considered in my medical decision making (see chart for details).     76 year old male presents from nursing facility with acute encephalopathy. He arrives with a MOST form that indicates he is DO NOT RESUSCITATE, comfort care intervention only and to determine need for antibiotics or fluids at time of presentation.  The patient's daughter arrived at bedside and after discussing the patient's current clinical appearance, likely pneumonia/infection or other progressive causing encephalopathy and the patient's prior wishes to minimize medical workup and intervention we discussed placement to home hospice. Currently he is requiring some oxygen but appears fairly comfortable and has not required any other medications here. No further workup will be attempted due to wishes and disposition. Case management was consulted to help evaluate for hospice placement. Patient is to be placed at home with hospice care from this facility.   Final Clinical Impressions(s) / ED Diagnoses   Final diagnoses:  Encounter for home hospice care  Acute and chronic respiratory failure with hypoxia (HCC)  Acute encephalopathy    New Prescriptions New Prescriptions   No medications on file     Lyndal Pulley, MD 03/26/16 1539

## 2016-03-26 NOTE — ED Notes (Signed)
MD at bedside. With family

## 2016-03-26 NOTE — ED Triage Notes (Signed)
[facility] Per [facility] patient last seen at 0900 wake alert and when rounded on patient was found to be unresponsive.  Patient unresponsive on arrival. Thick secretions  noted from mouth. RT suctioned. Patient responds to pain. Patient has not open eyes. NRB placed StATS   93%.

## 2016-03-26 NOTE — Care Management Note (Signed)
Case Management Note  Patient Details  Name: Jonetta OsgoodWarren L Name MRN: [id number] Date of Birth: 07-Sep-1940  Subjective/Objective: CM received consult from CSW re: Home with Hospice for this 76 y.o. M brought to the ED from [facility] unresponsive.  Family at bedside has chosen daughter Pura SpiceDonna Jones [phone] as spokesperson and she reports pt will not be returning to SNF. [place] [facility] and [facility]. CM contacted [facility] @ [phone] and will fax demographics with SSN, MD orders and MD note to [phone] when complete to facilitate referral. Bambi will contact family in meantime.                   Action/Plan: CM will continue to follow.    Expected Discharge Date:                  Expected Discharge Plan:  Home w Hospice Care  In-House Referral:  Clinical Social Work  Discharge planning Services  CM Consult  Post Acute Care Choice:    Choice offered to:  Adult Children Pura Spice([name] at bedside [phone])  DME Arranged:   [facility](Home H[facility]) DME Agency:     HH Arranged:    [facility]  Status of Service:  In process, will continue to follow  If discussed at Long Length of Stay Meetings, dates discussed:    Additional Comments:  Yvone NeuCrutchfield, Taino Maertens M, RN 03/26/2016, 3:41 PM

## 2016-03-26 NOTE — ED Notes (Signed)
Family at bedside. 

## 2016-03-27 ENCOUNTER — Other Ambulatory Visit: Payer: Self-pay | Admitting: *Deleted

## 2016-03-27 MED ORDER — MORPHINE SULFATE (CONCENTRATE) 20 MG/ML PO SOLN: 5.0000 mg | ORAL | 0 refills | Status: AC | PRN

## 2016-03-27 MED ORDER — MORPHINE SULFATE (CONCENTRATE) 20 MG/ML PO SOLN: 5.0000 mg | ORAL | 0 refills | Status: DC | PRN

## 2016-03-29 ENCOUNTER — Telehealth: Payer: Self-pay | Admitting: Pulmonary Disease

## 2016-03-30 NOTE — Telephone Encounter (Signed)
Will route message to make SN aware.

## 2016-03-30 DEATH — deceased

## 2016-04-04 ENCOUNTER — Telehealth: Payer: Self-pay

## 2016-04-04 NOTE — Telephone Encounter (Signed)
On 04/03/16 I received a death certificate from [place] [facility] [place] (original). The death certificate is for burial. The patient is a patient of Doctor Kriste Basqueadel. The death certificate will be taken to the Pulmonary Unit @ [facility] this pm for signature. On 04/04/16 I received the death certificate back from Doctor Kriste BasqueNadel. I got the death certificate ready and called the funeral home to let them know Alona BeneJoyce with [facility] will be by to pickup the death certificate.
